# Patient Record
Sex: Male | Born: 1995 | Race: Black or African American | Hispanic: No | Marital: Single | Smoking: Never smoker
Health system: Southern US, Community
[De-identification: ages and names within clinical notes are randomized; demographics above are authoritative.]

## PROBLEM LIST (undated history)

## (undated) ENCOUNTER — Ambulatory Visit (HOSPITAL_COMMUNITY): Admission: EM | Payer: Medicaid Other

## (undated) ENCOUNTER — Emergency Department (HOSPITAL_COMMUNITY): Discharge status: Absent without leave | Payer: Medicaid Other

## (undated) DIAGNOSIS — R011 Cardiac murmur, unspecified: Secondary | ICD-10-CM

## (undated) HISTORY — PX: HERNIA REPAIR: SHX51

---
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
## 2015-03-03 ENCOUNTER — Other Ambulatory Visit (HOSPITAL_COMMUNITY)
Admission: RE | Admit: 2015-03-03 | Discharge: 2015-03-03 | Disposition: A | Discharge status: Home / Self Care | Payer: Medicaid Other | Source: Ambulatory Visit | Attending: Family Medicine | Admitting: Family Medicine

## 2015-03-03 ENCOUNTER — Emergency Department (INDEPENDENT_AMBULATORY_CARE_PROVIDER_SITE_OTHER)
Admission: EM | Admit: 2015-03-03 | Discharge: 2015-03-03 | Disposition: A | Discharge status: Home / Self Care | Payer: Medicaid Other | Source: Home / Self Care | Attending: Family Medicine | Admitting: Family Medicine

## 2015-03-03 ENCOUNTER — Encounter (HOSPITAL_COMMUNITY): Payer: Self-pay | Admitting: Emergency Medicine

## 2015-03-03 DIAGNOSIS — Q549 Hypospadias, unspecified: Secondary | ICD-10-CM

## 2015-03-03 DIAGNOSIS — N342 Other urethritis: Secondary | ICD-10-CM | POA: Diagnosis not present

## 2015-03-03 DIAGNOSIS — Z113 Encounter for screening for infections with a predominantly sexual mode of transmission: Secondary | ICD-10-CM | POA: Insufficient documentation

## 2015-03-03 MED ORDER — CEFTRIAXONE SODIUM 250 MG IJ SOLR
250.0000 mg | Freq: Once | INTRAMUSCULAR | Status: AC
  Administered 2015-03-03: 250 mg via INTRAMUSCULAR

## 2015-03-03 MED ORDER — AZITHROMYCIN 250 MG PO TABS
1000.0000 mg | ORAL_TABLET | Freq: Once | ORAL | Status: AC
  Administered 2015-03-03: 1000 mg via ORAL

## 2015-03-03 MED ORDER — CEFTRIAXONE SODIUM 250 MG IJ SOLR
INTRAMUSCULAR | Status: AC
  Filled 2015-03-03: qty 250

## 2015-03-03 MED ORDER — LIDOCAINE HCL (PF) 1 % IJ SOLN
INTRAMUSCULAR | Status: AC
  Filled 2015-03-03: qty 5

## 2015-03-03 MED ORDER — AZITHROMYCIN 250 MG PO TABS
ORAL_TABLET | ORAL | Status: AC
  Filled 2015-03-03: qty 4

## 2015-03-03 NOTE — Discharge Instructions (Signed)
Thank you for coming in today.   Urethritis Urethritis is an inflammation of the tube through which urine exits your bladder (urethra).  CAUSES Urethritis is often caused by an infection in your urethra. The infection can be viral, like herpes. The infection can also be bacterial, like gonorrhea. RISK FACTORS Risk factors of urethritis include:  Having sex without using a condom.  Having multiple sexual partners.  Having poor hygiene. SIGNS AND SYMPTOMS Symptoms of urethritis are less noticeable in women than in men. These symptoms include:  Burning feeling when you urinate (dysuria).  Discharge from your urethra.  Blood in your urine (hematuria).  Urinating more than usual. DIAGNOSIS  To confirm a diagnosis of urethritis, your health care provider will do the following:  Ask about your sexual history.  Perform a physical exam.  Have you provide a sample of your urine for lab testing.  Use a cotton swab to gently collect a sample from your urethra for lab testing. TREATMENT  It is important to treat urethritis. Depending on the cause, untreated urethritis may lead to serious genital infections and possibly infertility. Urethritis caused by a bacterial infection is treated with antibiotic medicine. All sexual partners must be treated.  HOME CARE INSTRUCTIONS  Do not have sex until the test results are known and treatment is completed, even if your symptoms go away before you finish treatment.  If you were prescribed an antibiotic, finish it all even if you start to feel better. SEEK MEDICAL CARE IF:   Your symptoms are not improved in 3 days.  Your symptoms are getting worse.  You develop abdominal pain or pelvic pain (in women).  You develop joint pain.  You have a fever. SEEK IMMEDIATE MEDICAL CARE IF:   You have severe pain in the belly, back, or side.  You have repeated vomiting. MAKE SURE YOU:  Understand these instructions.  Will watch your  condition.  Will get help right away if you are not doing well or get worse. Document Released: 02/27/2001 Document Revised: 01/18/2014 Document Reviewed: 05/04/2013 [facility] Patient Information 2015 [place], [place]. This information is not intended to replace advice given to you by your health care provider. Make sure you discuss any questions you have with your health care provider.    Hypospadias Hypospadias is a birth defect. The opening of the urethra is not in its usual place. The urethra is the tube that empties urine from the bladder to the outside of the body. In mild cases, the urethra opens close to tip of the penis. In other cases, the urethra opens just below the ridge of the penis. It can also occur in the middle of the shaft or base of the penis. Less often, it opens on or behind the scrotum. Children with hypospadias can also have:  Part of the foreskin absent, giving a hooded or incomplete appearance.  A downward curve to the erect penis (chordee). CAUSES  Hypospadias is a common problem. The cause is not known. It may be passed down from parent to child (hereditary).  SYMPTOMS  The symptoms of hypospadias in boys depend on the location of the defect. In mild cases, there may be no symptoms. In other cases, common symptoms are:   Passing urine in abnormal directions or spraying.  Curved penis with erections (erections are common and normal in infancy).  Larger than normal urethral opening. Untreated hypospadias can have the following symptoms:  Needing to sit down to pass urine because of spraying or abnormal  stream direction.  Difficulty with toilet training.  Embarrassment about being different in his appearance.  Problems with sexual intercourse and fertility.  Painful erections. DIAGNOSIS  The diagnosis is usually made during a physical exam. However, hypospadias may be discovered during a routine circumcision. If the hypospadias is severe, the caregiver may  recommend further testing. These tests include an ultrasound, X-rays, or blood tests to rule out problems like kidney and other birth defects. TREATMENT  Surgery is the only treatment to correct the problem. It is best done before 18 months of age. The goal of surgery is to create a normal urethra and urethral opening. Curvature of the penis is corrected to allow for normal sexual intercourse and fertility. The penis is made to look normal. More than 1 surgery is sometimes needed. Circumcision should not be done at birth or before hypospadias surgery. The foreskin is usually needed during the corrective surgery. HOME CARE INSTRUCTIONS  An older boy may be embarrassed or upset when he becomes aware of his untreated hypospadias. He will need support and understanding to cope. After surgery, home care instructions specific to the type of surgery will be provided. SEEK MEDICAL CARE IF:   Your child has an oral temperature above 102 F (38.9 C).  Your baby is older than 3 months with a rectal temperature of 100.5 F (38.1 C) or higher for more than 1 day.  Your older child has signs of urinary tract infection.  Painful or frequent urination develops.  Urinary accidents happen. MAKE SURE YOU:   Understand these instructions.  Will watch your condition.  Will get help right away if you are not doing well or get worse. Document Released: 09/23/2007 Document Revised: 11/26/2011 Document Reviewed: 10/02/2007 [facility] Patient Information 2015 [place], [place]. This information is not intended to replace advice given to you by your health care provider. Make sure you discuss any questions you have with your health care provider.

## 2015-03-03 NOTE — ED Provider Notes (Signed)
Daniel [name] is a 19 y.o. male who presents to Urgent Care today for penile discharge. Symptoms present for 2 days. No fevers or chills nausea vomiting or diarrhea. No burning or testicle pain. No treatment tried yet.   Past Medical History  Diagnosis Date  . Heart murmur    Past Surgical History  Procedure Laterality Date  . Hernia repair     History  Substance Use Topics  . Smoking status: Current Every Day Smoker  . Smokeless tobacco: Not on file  . Alcohol Use: No   ROS as above Medications: No current facility-administered medications for this encounter.   No current outpatient prescriptions on file.   No Known Allergies   Exam:  BP 135/80 mmHg  Pulse 56  Temp(Src) 98.2 F (36.8 C) (Oral)  Resp 16  SpO2 99% Gen: Well NAD HEENT: EOMI,  MMM Lungs: Normal work of breathing. CTABL Heart: RRR no MRG Abd: NABS, Soft. Nondistended, Nontender Exts: Brisk capillary refill, warm and well perfused.  Genitals: Normal inguinal lymphadenopathy. Testicles are descended bilaterally and nontender with no masses Penis is circumcised with hypospadias distally to just proximal to the glans.  Whitish discharge from the urethra is present. Nontender.  Patient was given 1 g oral azithromycin and 250 mg of intramuscular ceftriaxone prior to discharge.  No results found for this or any previous visit (from the past 24 hour(s)). No results found.  Assessment and Plan: 19 y.o. male with urethritis. Treat with ceftriaxone and azithromycin. Urine cytology for gonorrhea Chlamydia Trichomonas pending. HIV and syphilis test pending.  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 Informed him and provided a handout.  Discussed warning signs or symptoms. Please see discharge instructions. Patient expresses understanding.     Rodolph Bong, MD 03/03/15 [phone]

## 2015-03-03 NOTE — ED Notes (Signed)
Here for std check States no sx States he does not know if he was exposed

## 2015-03-04 LAB — HIV ANTIBODY (ROUTINE TESTING W REFLEX): HIV SCREEN 4TH GENERATION: NONREACTIVE

## 2015-03-04 LAB — RPR: RPR Ser Ql: NONREACTIVE

## 2015-03-04 LAB — URINE CYTOLOGY ANCILLARY ONLY
CHLAMYDIA, DNA PROBE: POSITIVE — AB
Neisseria Gonorrhea: POSITIVE — AB
Trichomonas: NEGATIVE

## 2015-03-05 NOTE — ED Notes (Signed)
Attempted to call patient to inform patient of his positive GC and chlamydia . Was treated adequately while in clinic w rocephin and azithromycin. Message on # provided at check in states this is not a working number (rechecked and dialed again ) letter sent to last known address provided . Form 2124 [employer] completed for #300 and #200 completed and faxed to [facility] for their records

## 2015-03-10 NOTE — ED Notes (Signed)
Patient returned call as result of letter. After verifying ID, discussed positive findings. Advised to practice safer sex

## 2015-05-22 ENCOUNTER — Emergency Department (HOSPITAL_COMMUNITY): Payer: Medicaid Other

## 2015-05-22 ENCOUNTER — Encounter (HOSPITAL_COMMUNITY): Payer: Self-pay

## 2015-05-22 ENCOUNTER — Emergency Department (HOSPITAL_COMMUNITY)
Admission: EM | Admit: 2015-05-22 | Discharge: 2015-05-22 | Disposition: A | Discharge status: Home / Self Care | Payer: Medicaid Other | Attending: Emergency Medicine | Admitting: Emergency Medicine

## 2015-05-22 DIAGNOSIS — Y9367 Activity, basketball: Secondary | ICD-10-CM | POA: Diagnosis not present

## 2015-05-22 DIAGNOSIS — Y92310 Basketball court as the place of occurrence of the external cause: Secondary | ICD-10-CM | POA: Insufficient documentation

## 2015-05-22 DIAGNOSIS — X58XXXA Exposure to other specified factors, initial encounter: Secondary | ICD-10-CM | POA: Insufficient documentation

## 2015-05-22 DIAGNOSIS — S43015A Anterior dislocation of left humerus, initial encounter: Secondary | ICD-10-CM

## 2015-05-22 DIAGNOSIS — Y998 Other external cause status: Secondary | ICD-10-CM | POA: Insufficient documentation

## 2015-05-22 DIAGNOSIS — R011 Cardiac murmur, unspecified: Secondary | ICD-10-CM | POA: Insufficient documentation

## 2015-05-22 DIAGNOSIS — Z720 Tobacco use: Secondary | ICD-10-CM | POA: Insufficient documentation

## 2015-05-22 DIAGNOSIS — S4992XA Unspecified injury of left shoulder and upper arm, initial encounter: Secondary | ICD-10-CM | POA: Diagnosis present

## 2015-05-22 MED ORDER — IBUPROFEN 800 MG PO TABS
800.0000 mg | ORAL_TABLET | Freq: Three times a day (TID) | ORAL | Status: DC
Start: 2015-05-22 — End: 2016-01-10

## 2015-05-22 MED ORDER — PROPOFOL 10 MG/ML IV BOLUS
INTRAVENOUS | Status: AC | PRN
  Administered 2015-05-22: 30 mg via INTRAVENOUS
  Administered 2015-05-22: 68 mg via INTRAVENOUS

## 2015-05-22 MED ORDER — HYDROMORPHONE HCL 1 MG/ML IJ SOLN
1.0000 mg | Freq: Once | INTRAMUSCULAR | Status: DC
  Filled 2015-05-22: qty 1

## 2015-05-22 MED ORDER — PROPOFOL 10 MG/ML IV BOLUS
1.0000 mg/kg | Freq: Once | INTRAVENOUS | Status: AC
  Administered 2015-05-22: 68 mg via INTRAVENOUS
  Filled 2015-05-22: qty 1

## 2015-05-22 MED ORDER — HYDROMORPHONE HCL 1 MG/ML IJ SOLN
1.0000 mg | Freq: Once | INTRAMUSCULAR | Status: AC
  Administered 2015-05-22: 1 mg via INTRAVENOUS

## 2015-05-22 NOTE — ED Notes (Signed)
Pt has periods of bradycardia but is asymptomatic. A&Ox4. Advised to ambulate in hall per MD Knott. Ok for patient to return home as long as he is asymptomatic with steady gait while ambulating. Pt only reports he feels "weak." No other c/c.

## 2015-05-22 NOTE — ED Notes (Signed)
Pt have periods of bradycardia - will continue to monitor before being dc'ed.

## 2015-05-22 NOTE — ED Notes (Signed)
Pt transported by [facility] for right shoulder injury/dislocation.  Pt was playing basketball approx 30 min ago when injury occurred. Pt has hx of left shoulder dislocation x 8.

## 2015-05-22 NOTE — ED Notes (Signed)
Pt ambulatory while maintaining HR 70-90 bpm. Saturations 95-100%. No dizziness noted. No other adverse effects.

## 2015-05-22 NOTE — ED Notes (Signed)
Bed: WA02 Expected date:  Expected time:  Means of arrival:  Comments: EMS- Shoulder dislocation

## 2015-05-22 NOTE — Discharge Instructions (Signed)
Shoulder Dislocation  Your shoulder is made up of three bones: the collar bone (clavicle); the shoulder blade (scapula), which includes the socket (glenoid cavity); and the upper arm bone (humerus). Your shoulder joint is the place where these bones meet. Strong, fibrous tissues hold these bones together (ligaments). Muscles and strong, fibrous tissues that connect the muscles to these bones (tendons) allow your arm to move through this joint. The range of motion of your shoulder joint is more extensive than most of your other joints, and the glenoid cavity is very shallow. That is the reason that your shoulder joint is one of the most unstable joints in your body. It is far more prone to dislocation than your other joints. Shoulder dislocation is when your humerus is forced out of your shoulder joint.  CAUSES  Shoulder dislocation is caused by a forceful impact on your shoulder. This impact usually is from an injury, such as a sports injury or a fall.  SYMPTOMS  Symptoms of shoulder dislocation include:  · Deformity of your shoulder.  · Intense pain.  · Inability to move your shoulder joint.  · Numbness, weakness, or tingling around your shoulder joint (your neck or down your arm).  · Bruising or swelling around your shoulder.  DIAGNOSIS  In order to diagnose a dislocated shoulder, your caregiver will perform a physical exam. Your caregiver also may have an X-ray exam done to see if you have any broken bones. Magnetic resonance imaging (MRI) is a procedure that sometimes is done to help your caregiver see any damage to the soft tissues around your shoulder, particularly your rotator cuff tendons. Additionally, your caregiver also may have electromyography done to measure the electrical discharges produced in your muscles if you have signs or symptoms of nerve damage.  TREATMENT  A shoulder dislocation is treated by placing the humerus back in the joint (reduction). Your caregiver does this either manually (closed  reduction), by moving your humerus back into the joint through manipulation, or through surgery (open reduction). When your humerus is back in place, severe pain should improve almost immediately.  You also may need to have surgery if you have a weak shoulder joint or ligaments, and you have recurring shoulder dislocations, despite rehabilitation. In rare cases, surgery is necessary if your nerves or blood vessels are damaged during the dislocation.  After your reduction, your arm will be placed in a shoulder immobilizer or sling to keep it from moving. Your caregiver will have you wear your shoulder immobilizer or sling for 3 days to 3 weeks, depending on how serious your dislocation is. When your shoulder immobilizer or sling is removed, your caregiver may prescribe physical therapy to help improve the range of motion in your shoulder joint.  HOME CARE INSTRUCTIONS   The following measures can help to reduce pain and speed up the healing process:  · Rest your injured joint. Do not move it. Avoid activities similar to the one that caused your injury.  · Apply ice to your injured joint for the first day or two after your reduction or as directed by your caregiver. Applying ice helps to reduce inflammation and pain.  ¨ Put ice in a plastic bag.  ¨ Place a towel between your skin and the bag.  ¨ Leave the ice on for 15-20 minutes at a time, every 2 hours while you are awake.  · Exercise your hand by squeezing a soft ball. This helps to eliminate stiffness and swelling in your hand and   wrist.  · Take over-the-counter or prescription medicine for pain or discomfort as told by your caregiver.  SEEK IMMEDIATE MEDICAL CARE IF:   · Your shoulder immobilizer or sling becomes damaged.  · Your pain becomes worse rather than better.  · You lose feeling in your arm or hand, or they become white and cold.  MAKE SURE YOU:   · Understand these instructions.  · Will watch your condition.  · Will get help right away if you are not  doing well or get worse.  Document Released: 05/29/2001 Document Revised: 01/18/2014 Document Reviewed: 06/24/2011  Exit[employer]® Patient Information ©2015 [employer]. This information is not intended to replace advice given to you by your health care provider. Make sure you discuss any questions you have with your health care provider.

## 2015-05-22 NOTE — ED Provider Notes (Signed)
CSN: [id number]     Arrival date & time 05/22/15  1340 History   First MD Initiated Contact with Patient 05/22/15 1356     Chief Complaint  Patient presents with  . Shoulder Injury    Left shoulder dislocation approx ago.       (Consider location/radiation/quality/duration/timing/severity/associated sxs/prior Treatment) Patient is a 19 y.o. male presenting with shoulder injury.  Shoulder Injury This is a recurrent problem. The current episode started less than 1 hour ago. The problem occurs constantly. The problem has not changed since onset.Pertinent negatives include no chest pain, no abdominal pain and no shortness of breath. Nothing aggravates the symptoms. Nothing relieves the symptoms. He has tried nothing for the symptoms.    Past Medical History  Diagnosis Date  . Heart murmur    Past Surgical History  Procedure Laterality Date  . Hernia repair     No family history on file. Social History  Substance Use Topics  . Smoking status: Current Every Day Smoker  . Smokeless tobacco: None  . Alcohol Use: No    Review of Systems  Respiratory: Negative for shortness of breath.   Cardiovascular: Negative for chest pain.  Gastrointestinal: Negative for abdominal pain.  All other systems reviewed and are negative.     Allergies  Review of patient's allergies indicates no known allergies.  Home Medications   Prior to Admission medications   Medication Sig Start Date End Date Taking? Authorizing Provider  ibuprofen (ADVIL,MOTRIN) 800 MG tablet Take 1 tablet (800 mg total) by mouth 3 (three) times daily. 05/22/15   Mirian Mo, MD   BP 162/93 mmHg  Pulse 66  Temp(Src) 98.6 F (37 C) (Oral)  Resp 16  SpO2 100% Physical Exam  Constitutional: He is oriented to person, place, and time. He appears well-developed and well-nourished.  HENT:  Head: Normocephalic and atraumatic.  Eyes: Conjunctivae and EOM are normal.  Neck: Normal range of motion. Neck supple.   Cardiovascular: Normal rate, regular rhythm and normal heart sounds.   Pulmonary/Chest: Effort normal and breath sounds normal. No respiratory distress.  Abdominal: He exhibits no distension. There is no tenderness. There is no rebound and no guarding.  Musculoskeletal:       Left shoulder: He exhibits decreased range of motion (dislocated anteriorly by exam, NV intact distally), tenderness, bony tenderness and deformity.  Neurological: He is alert and oriented to person, place, and time.  Skin: Skin is warm and dry.  Vitals reviewed.   ED Course  Reduction of dislocation Date/Time: 05/22/2015 2:42 PM Performed by: Mirian Mo Authorized by: Mirian Mo Consent: Verbal consent obtained. Local anesthesia used: no Patient sedated: yes Sedation type: moderate (conscious) sedation Sedatives: propofol Sedation start date/time: 05/22/2015 2:15 PM Sedation end date/time: 05/22/2015 2:35 PM Vitals: Vital signs were monitored during sedation. Patient tolerance: Patient tolerated the procedure well with no immediate complications   (including critical care time) Labs Review Labs Reviewed - No data to display  Imaging Review Dg Shoulder Left Port  05/22/2015   CLINICAL DATA:  Left shoulder pain after running into another player while playing basketball.  EXAM: LEFT SHOULDER - 1 VIEW  COMPARISON:  Previous examinations, the most recent dated 05/06/2013.  FINDINGS: A single frontal view of the left shoulder was requested and obtained. This demonstrates anterior, inferior dislocation of the humeral head relative to the acromion. This has an appearance similar to that seen on 03/24/2013. No fracture is visible on these views.  IMPRESSION: Anterior left shoulder dislocation.  Electronically Signed   By: Beckie Salts M.D.   On: 05/22/2015 14:29   I have personally reviewed and evaluated these images and lab results as part of my medical decision-making.   EKG Interpretation None      MDM    Final diagnoses:  Anterior shoulder dislocation, left, initial encounter    19 y.o. 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  Reduced uneventfully as above.  Placed in sling.  To fu with orthopedics.  DC home in stable condition  I have reviewed all laboratory and imaging studies if ordered as above  1. Anterior shoulder dislocation, left, initial encounter         Mirian Mo, MD 05/22/15 1444

## 2015-07-06 ENCOUNTER — Encounter (HOSPITAL_COMMUNITY): Payer: Self-pay | Admitting: Emergency Medicine

## 2015-07-06 ENCOUNTER — Emergency Department (HOSPITAL_COMMUNITY)
Admission: EM | Admit: 2015-07-06 | Discharge: 2015-07-06 | Disposition: A | Discharge status: Home / Self Care | Payer: Medicaid Other | Attending: Emergency Medicine | Admitting: Emergency Medicine

## 2015-07-06 DIAGNOSIS — R011 Cardiac murmur, unspecified: Secondary | ICD-10-CM | POA: Insufficient documentation

## 2015-07-06 DIAGNOSIS — Z202 Contact with and (suspected) exposure to infections with a predominantly sexual mode of transmission: Secondary | ICD-10-CM | POA: Diagnosis present

## 2015-07-06 DIAGNOSIS — Z720 Tobacco use: Secondary | ICD-10-CM | POA: Diagnosis not present

## 2015-07-06 DIAGNOSIS — A64 Unspecified sexually transmitted disease: Secondary | ICD-10-CM

## 2015-07-06 DIAGNOSIS — Z791 Long term (current) use of non-steroidal anti-inflammatories (NSAID): Secondary | ICD-10-CM | POA: Diagnosis not present

## 2015-07-06 MED ORDER — AZITHROMYCIN 250 MG PO TABS
1000.0000 mg | ORAL_TABLET | Freq: Once | ORAL | Status: AC
  Administered 2015-07-06: 1000 mg via ORAL
  Filled 2015-07-06: qty 4

## 2015-07-06 MED ORDER — CEFTRIAXONE SODIUM 250 MG IJ SOLR
250.0000 mg | Freq: Once | INTRAMUSCULAR | Status: AC
  Administered 2015-07-06: 250 mg via INTRAMUSCULAR
  Filled 2015-07-06: qty 250

## 2015-07-06 MED ORDER — METRONIDAZOLE 500 MG PO TABS
2000.0000 mg | ORAL_TABLET | Freq: Once | ORAL | Status: AC
  Administered 2015-07-06: 2000 mg via ORAL
  Filled 2015-07-06: qty 4

## 2015-07-06 NOTE — ED Notes (Signed)
Patient states was told that he needs to be checked for chlamydia, after being exposed to a girl that tested positive last week.

## 2015-07-06 NOTE — ED Notes (Signed)
No symptoms 

## 2015-07-06 NOTE — ED Provider Notes (Signed)
CSN: [id number]     Arrival date & time 07/06/15  1101 History  By signing my name below, I, Essence Howell, attest that this documentation has been prepared under the direction and in the presence of Harolyn RutherfordShawn Joy, PA-C Electronically Signed: Charline BillsEssence Howell, ED Scribe 07/06/2015 at 12:15 PM.   Chief Complaint  Patient presents with  . SEXUALLY TRANSMITTED DISEASE   The history is provided by the patient. No language interpreter was used.   HPI Comments: Michele McalpineShyquan L Huertas is a 19 y.o. male who presents to the Emergency Department for STD exposure. Pt reports unprotected sexual intercourse last week with a male partner who tested positive for Chlamydia. He denies fever, chills, nausea, vomiting, abdominal pain, dysuria, penile discharge, penile pain, genital lesions. Denies any previous STD history.   Past Medical History  Diagnosis Date  . Heart murmur    Past Surgical History  Procedure Laterality Date  . Hernia repair     No family history on file. Social History  Substance Use Topics  . Smoking status: Current Every Day Smoker  . Smokeless tobacco: None  . Alcohol Use: No    Review of Systems  Constitutional: Negative for fever and chills.  Gastrointestinal: Negative for nausea, vomiting and abdominal pain.  Genitourinary: Negative for dysuria, discharge, genital sores and penile pain.   Allergies  Review of patient's allergies indicates no known allergies.  Home Medications   Prior to Admission medications   Medication Sig Start Date End Date Taking? Authorizing Provider  ibuprofen (ADVIL,MOTRIN) 800 MG tablet Take 1 tablet (800 mg total) by mouth 3 (three) times daily. 05/22/15   Mirian MoMatthew Gentry, MD   BP 135/72 mmHg  Pulse 55  Temp(Src) 98.2 F (36.8 C) (Oral)  Resp 16  SpO2 100% Physical Exam  Constitutional: He is oriented to person, place, and time. He appears well-developed and well-nourished. No distress.  HENT:  Head: Normocephalic and atraumatic.  Eyes:  Conjunctivae and EOM are normal.  Neck: Neck supple.  Cardiovascular: Normal rate and regular rhythm.   Pulmonary/Chest: Effort normal. No respiratory distress.  Abdominal: Soft.  Neurological: He is alert and oriented to person, place, and time.  Skin: Skin is warm and dry.  Psychiatric: He has a normal mood and affect. His behavior is normal.  Nursing note and vitals reviewed.  ED Course  Procedures (including critical care time) DIAGNOSTIC STUDIES: Oxygen Saturation is 100% on RA, normal by my interpretation.    COORDINATION OF CARE: 11:36 AM-Discussed treatment plan which includes STD screening with pt at bedside and pt agreed to plan.   Labs Review Labs Reviewed  RPR  HIV ANTIBODY (ROUTINE TESTING)  GC/CHLAMYDIA PROBE AMP [facility]   Imaging Review No results found. I have personally reviewed and evaluated these images and lab results as part of my medical decision-making.   EKG Interpretation None      MDM   Final diagnoses:  STD (male)    Jamse BelfastShyquan L Welsch presents for testing and treatment due to exposure to Chlamydia.  Pt counseled on safe sex practices. STD panel ordered. Pt told he would receive results by phone in the next few days. ABX administered empirically for GC/Chlamydia and Trichomonas.   I personally performed the services described in this documentation, which was scribed in my presence. The recorded information has been reviewed and is accurate.    Anselm PancoastShawn C Joy, PA-C 07/06/15 1227  Gilda Creasehristopher J Pollina, MD 07/06/15 1230

## 2015-07-06 NOTE — Discharge Instructions (Signed)
You have been seen today for suspicion of a STD. Your test results may take a few days to be returned. You will be notified by telephone.  You have also been treated with antibiotics today. Follow up with PCP as needed. Return to ED should symptoms worsen.   Sexually Transmitted Disease A sexually transmitted disease (STD) is a disease or infection that may be passed (transmitted) from person to person, usually during sexual activity. This may happen by way of saliva, semen, blood, vaginal mucus, or urine. Common STDs include:  Gonorrhea.  Chlamydia.  Syphilis.  HIV and AIDS.  Genital herpes.  Hepatitis B and C.  Trichomonas.  Human papillomavirus (HPV).  Pubic lice.  Scabies.  Mites.  Bacterial vaginosis. WHAT ARE CAUSES OF STDs? An STD may be caused by bacteria, a virus, or parasites. STDs are often transmitted during sexual activity if one person is infected. However, they may also be transmitted through nonsexual means. STDs may be transmitted after:   Sexual intercourse with an infected person.  Sharing sex toys with an infected person.  Sharing needles with an infected person or using unclean piercing or tattoo needles.  Having intimate contact with the genitals, mouth, or rectal areas of an infected person.  Exposure to infected fluids during birth. WHAT ARE THE SIGNS AND SYMPTOMS OF STDs? Different STDs have different symptoms. Some people may not have any symptoms. If symptoms are present, they may include:  Painful or bloody urination.  Pain in the pelvis, abdomen, vagina, anus, throat, or eyes.  A skin rash, itching, or irritation.  Growths, ulcerations, blisters, or sores in the genital and anal areas.  Abnormal vaginal discharge with or without bad odor.  Penile discharge in men.  Fever.  Pain or bleeding during sexual intercourse.  Swollen glands in the groin area.  Yellow skin and eyes (jaundice). This is seen with hepatitis.  Swollen  testicles.  Infertility.  Sores and blisters in the mouth. HOW ARE STDs DIAGNOSED? To make a diagnosis, your health care provider may:  Take a medical history.  Perform a physical exam.  Take a sample of any discharge to examine.  Swab the throat, cervix, opening to the penis, rectum, or vagina for testing.  Test a sample of your first morning urine.  Perform blood tests.  Perform a Pap test, if this applies.  Perform a colposcopy.  Perform a laparoscopy. HOW ARE STDs TREATED? Treatment depends on the STD. Some STDs may be treated but not cured.  Chlamydia, gonorrhea, trichomonas, and syphilis can be cured with antibiotic medicine.  Genital herpes, hepatitis, and HIV can be treated, but not cured, with prescribed medicines. The medicines lessen symptoms.  Genital warts from HPV can be treated with medicine or by freezing, burning (electrocautery), or surgery. Warts may come back.  HPV cannot be cured with medicine or surgery. However, abnormal areas may be removed from the cervix, vagina, or vulva.  If your diagnosis is confirmed, your recent sexual partners need treatment. This is true even if they are symptom-free or have a negative culture or evaluation. They should not have sex until their health care providers say it is okay.  Your health care provider may test you for infection again 3 months after treatment. HOW CAN I REDUCE MY RISK OF GETTING AN STD? Take these steps to reduce your risk of getting an STD:  Use latex condoms, dental dams, and water-soluble lubricants during sexual activity. Do not use petroleum jelly or oils.  Avoid having  multiple sex partners.  Do not have sex with someone who has other sex partners  Do not have sex with anyone you do not know or who is at high risk for an STD.  Avoid risky sex practices that can break your skin.  Do not have sex if you have open sores on your mouth or skin.  Avoid drinking too much alcohol or taking  illegal drugs. Alcohol and drugs can affect your judgment and put you in a vulnerable position.  Avoid engaging in oral and anal sex acts.  Get vaccinated for HPV and hepatitis. If you have not received these vaccines in the past, talk to your health care provider about whether one or both might be right for you.  If you are at risk of being infected with HIV, it is recommended that you take a prescription medicine daily to prevent HIV infection. This is called pre-exposure prophylaxis (PrEP). You are considered at risk if:  You are a man who has sex with other men (MSM).  You are a heterosexual man or woman and are sexually active with more than one partner.  You take drugs by injection.  You are sexually active with a partner who has HIV.  Talk with your health care provider about whether you are at high risk of being infected with HIV. If you choose to begin PrEP, you should first be tested for HIV. You should then be tested every 3 months for as long as you are taking PrEP. WHAT SHOULD I DO IF I THINK I HAVE AN STD?  See your health care provider.  Tell your sexual partner(s). They should be tested and treated for any STDs.  Do not have sex until your health care provider says it is okay. WHEN SHOULD I GET IMMEDIATE MEDICAL CARE? Contact your health care provider right away if:   You have severe abdominal pain.  You are a man and notice swelling or pain in your testicles.  You are a woman and notice swelling or pain in your vagina.   This information is not intended to replace advice given to you by your health care provider. Make sure you discuss any questions you have with your health care provider.   Document Released: 11/24/2002 Document Revised: 09/24/2014 Document Reviewed: 03/24/2013 Elsevier Interactive Patient Education 2016 [employer]   Emergency Department Resource Guide 1) Find a Doctor and Pay Out of Pocket Although you won't have to find out who is  covered by your insurance plan, it is a good idea to ask around and get recommendations. You will then need to call the office and see if the doctor you have chosen will accept you as a new patient and what types of options they offer for patients who are self-pay. Some doctors offer discounts or will set up payment plans for their patients who do not have insurance, but you will need to ask so you aren't surprised when you get to your appointment.  2) Contact Your Local Health Department Not all health departments have doctors that can see patients for sick visits, but many do, so it is worth a call to see if yours does. If you don't know where your local health department is, you can check in your phone book. The CDC also has a tool to help you locate your state's health department, and many state websites also have listings of all of their local health departments.  3) Find a Walk-in Clinic If your illness is not  likely to be very severe or complicated, you may want to try a walk in clinic. These are popping up all over the country in pharmacies, drugstores, and shopping centers. They're usually staffed by nurse practitioners or physician assistants that have been trained to treat common illnesses and complaints. They're usually fairly quick and inexpensive. However, if you have serious medical issues or chronic medical problems, these are probably not your best option.  No Primary Care Doctor: - Call Health Connect at  [phone] - they can help you locate a primary care doctor that  accepts your insurance, provides certain services, etc. - Physician Referral Service- [phone]  Chronic Pain Problems: Organization         Address  Phone   Notes  [facility]  [phone] Patients need to be referred by their primary care doctor.   Medication Assistance: Organization         Address  Phone   Notes  [facility] Medication [facility] [address] [place], [place] [address] [phone] --Must be a resident of [facility] -- Must have NO insurance coverage whatsoever (no Medicaid/ Medicare, etc.) -- The pt. MUST have a primary care doctor that directs their care regularly and follows them in the community   MedAssist  [phone]   [employer]  [phone]    Agencies that provide inexpensive medical care: Organization         Address  Phone   Notes  [place]  [phone]   [facility]    [phone]   [facility] [place], [place] [address] [phone]   [place] [address] [place] [phone]   Planned Parenthood    [phone]   [facility]    [phone]   [place] [address] [place], [place] Phone:  [phone], Fax:  [phone] Hours of Operation:  9 am - 6 pm, M-F.  Also accepts Medicaid/Medicare and self-pay.  [facility] for [place] [address], [address], [place] Phone: [phone], Fax: [phone]. Hours of Operation:  8:30 am - 5:30 pm, M-F.  Also accepts Medicaid and self-pay.  [facility] [place] [address], [place] Phone: [phone]   [place], [place], [place] [phone], Ext. [phone] Mondays & Thursdays: 7-9 AM.  First 15 patients are seen on a first come, first serve basis.    [place] Providers:  Organization         Address  Phone   Notes  [facility] [address],  [phone] Also accepts self-pay patients.  [facility] [address], [place]  [phone]   [place], [address], [place] [phone]   [facility] [address] [place] [phone]   Lucianne Lei [address], [place]   [phone] Only accepts [place] Access [place] patients after they have their name applied to their card.   Self-Pay (no insurance) in [facility]:  Organization         Address  Phone   Notes  Sickle Cell Patients, [facility] [place] [phone]   Zacarias Pontes  [facility] [place] [phone]   [facility] [place]  [place], [place], [place] [phone]   [facility]/Dr. Osei-Bonsu  [address] [place] or [address], [place] [phone] Phone number for both [place] and [place] locations is the same.  [facility] [address] [place] [phone]   [facility] [address], [place] or [address] [phone]   [facility] [address] [place] [phone], phone; [phone], fax Sees patients 1st and 3rd Saturday of every month.  Must not qualify for public or private insurance (i.e. Medicaid, Medicare, [place] Health Choice, Veterans' Benefits)  Household income should be no more than 200% of the poverty level The clinic cannot treat you if you are pregnant or think you are pregnant  Sexually transmitted diseases are not treated at the clinic.    Dental Care: Organization         Address  Phone  Notes  [facility] Department of [facility] [place] [phone] Accepts children up to age 75 who are enrolled in [place] or [place]; pregnant women with a Medicaid card; and children who have applied for Medicaid or [place] Health Choice, but were declined, whose parents can pay a reduced fee at time of service.  [facility]  [address], [place] [phone] Accepts children up to age 53 who are enrolled  in [place] or [place]; pregnant women with a Medicaid card; and children who have applied for Medicaid or [place] Health Choice, but were declined, whose parents can pay a reduced fee at time of service.  [facility] Adult [facility] Access PROGRAM  [place] [phone] Patients are seen by appointment only. Walk-ins are not accepted. [place] will see patients 73 years of age and older. Monday - Tuesday (8am-5pm) Most Wednesdays (8:30-5pm) $30 per visit, cash only  [facility] Adult Dental Access PROGRAM  [address], [facility] [phone] Patients are seen by appointment only. Walk-ins are not accepted. [place] will see patients 43 years of age and older. One Wednesday Evening (Monthly: Volunteer Based).  $30 per visit, cash only  [place]  [phone] for adults; Children under age 13, call Graduate Pediatric Dentistry at [phone]. Children aged 13-14, please call [phone] to request a pediatric application.  Dental services are provided in all areas of dental care including fillings, crowns and bridges, complete and partial dentures, implants, gum treatment, root canals, and extractions. Preventive care is also provided. Treatment is provided to both adults and children. Patients are selected via a lottery and there is often a waiting list.   [facility] [address] [place]  [phone] [email or web address]   [facility] [address] [place], [place] [phone], Ext. [phone] Second and Fourth Thursday of each month, opens at 6:30 AM; Clinic ends at 9 AM.  Patients are seen on a first-come first-served basis, and a limited number are seen during each clinic.   [facility]  [address]ank[address] [place], [place] [phone]   Eligibility Requirements You must have lived in [place], [place], or [place] counties for at least the last three months.  You cannot be  eligible for state or federal sponsored [employer], including [employer], [place], or [employer].   You generally cannot be eligible for healthcare insurance through your employer.    How to apply: Eligibility screenings are held every Tuesday and Wednesday afternoon from 1:00 pm until 4:00 pm. You do not need an appointment for the interview!  [facility] [address], [place], [place]   [place]  [place] Department  [place]  [phone]    Behavioral Health Resources in the Community: Intensive Outpatient Programs Organization         Address  Phone  Notes  [place]. [address] [place], [place] [phone]   [facility] Outpatient [address] [place], [place]   ADS: Alcohol & Drug Svcs [address], [place], [place]   [place] [address],  [place], [place] or [phone]   Substance Abuse Resources Organization         Address  Phone  Notes  Alcohol and Drug Services  [phone]   [place]  [phone]   [facility] [place]   Chinita Pester  [phone]   Residential & Outpatient Substance Abuse Program  [phone]   Psychological Services Organization         Address  Phone  Notes  [facility] Accident  [place]  [phone]   [place] [address], [place] or [phone]    Mobile Crisis Teams Organization         Address  Phone  Notes  [facility], Mobile Crisis Care Unit  [phone]   [facility]ive Psychotherapeutic Services  [address] [place], [place]   [place] Levels [address] [place] [phone]    Self-Help/Support Groups Organization          Address  Phone             Notes  [place]. of [place] - variety of support groups  [place] Call for more information  Narcotics Anonymous (NA), [facility] [address], [employer] [place]  2 meetings at this location   Special educational needs teacher         Address  Phone  Notes  ASAP Residential Treatment [place],    [place]  [phone]   [facility]  [address] [place] [phone], [place], [place]   [place], [place] [phone] Admissions: 8am-3pm M-F  Incentives Substance [place] [address]    [place], [place] [phone]   [facility] [address] [place], [place], [place]   [facility] [address]  [place], [place]   [facility]ight Programs - Intensive Outpatient [address] Kristeen [address], [place], [place]   [facility] ([place] [place].,  [place], [place] [phone] or [phone]   [facility]idential Treatment Services (RTS) [address], [place], [place] Accepts Medicaid  [facility] [place] [address]  [place] [phone] Substance Abuse/Addiction Treatment   [facility] Organization         Address  Phone  Notes  [facility]  [phone]   Domenic Schwab, PhD [phone], [place]   [phone] or [phone]   [facility]   [address] [place], [place] [phone]   [facility] [address], [place], [place] [phone] Insurance/Medicaid/sponsorship through [facility] and Families [address]                                    [place], [place] [phone] Therapy/tele-psych/case  [facility] [address], [place] [phone]    Dr. Lolly Mustache  [phone]   [facility] [place]  [employer]  [facility] Dept. 1) [address], [place] 2) [address], [place] 3)  [address], [place] [phone]  [phone]   [facility] Child Abuse Hotline 435-309-1663 or [phone] (After Hours)

## 2015-07-07 LAB — RPR: RPR Ser Ql: NONREACTIVE

## 2015-07-07 LAB — GC/CHLAMYDIA PROBE AMP (~~LOC~~) NOT AT ARMC
CHLAMYDIA, DNA PROBE: POSITIVE — AB
NEISSERIA GONORRHEA: NEGATIVE

## 2015-07-07 LAB — HIV ANTIBODY (ROUTINE TESTING W REFLEX): HIV Screen 4th Generation wRfx: NONREACTIVE

## 2015-07-08 ENCOUNTER — Telehealth (HOSPITAL_COMMUNITY): Payer: Self-pay

## 2015-07-08 NOTE — Telephone Encounter (Signed)
[facility]  (+) for chlamydia.  Treated with Zithromax and Rocephin.  Pt ID verified x 2 (DOB & address).  Pt informed of dx, tx rcvd appropriate, notify partner & abstain from sex x 2 weeks.  DHHS form completed and faxed.

## 2015-08-06 ENCOUNTER — Encounter (HOSPITAL_COMMUNITY): Payer: Self-pay | Admitting: *Deleted

## 2015-08-06 ENCOUNTER — Emergency Department (HOSPITAL_COMMUNITY)
Admission: EM | Admit: 2015-08-06 | Discharge: 2015-08-06 | Disposition: A | Discharge status: Home / Self Care | Payer: Medicaid Other | Attending: Emergency Medicine | Admitting: Emergency Medicine

## 2015-08-06 DIAGNOSIS — F17200 Nicotine dependence, unspecified, uncomplicated: Secondary | ICD-10-CM | POA: Insufficient documentation

## 2015-08-06 DIAGNOSIS — Z791 Long term (current) use of non-steroidal anti-inflammatories (NSAID): Secondary | ICD-10-CM | POA: Diagnosis not present

## 2015-08-06 DIAGNOSIS — R011 Cardiac murmur, unspecified: Secondary | ICD-10-CM | POA: Diagnosis not present

## 2015-08-06 DIAGNOSIS — Z202 Contact with and (suspected) exposure to infections with a predominantly sexual mode of transmission: Secondary | ICD-10-CM | POA: Diagnosis present

## 2015-08-06 DIAGNOSIS — R369 Urethral discharge, unspecified: Secondary | ICD-10-CM | POA: Insufficient documentation

## 2015-08-06 MED ORDER — AZITHROMYCIN 250 MG PO TABS
1000.0000 mg | ORAL_TABLET | Freq: Once | ORAL | Status: AC
  Administered 2015-08-06: 1000 mg via ORAL
  Filled 2015-08-06: qty 4

## 2015-08-06 MED ORDER — CEFTRIAXONE SODIUM 250 MG IJ SOLR
250.0000 mg | Freq: Once | INTRAMUSCULAR | Status: AC
  Administered 2015-08-06: 250 mg via INTRAMUSCULAR
  Filled 2015-08-06: qty 250

## 2015-08-06 MED ORDER — STERILE WATER FOR INJECTION IJ SOLN
10.0000 mL | Freq: Once | INTRAMUSCULAR | Status: AC
  Administered 2015-08-06: 10 mL via INTRAMUSCULAR
  Filled 2015-08-06: qty 10

## 2015-08-06 NOTE — ED Provider Notes (Signed)
CSN: [id number]     Arrival date & time 08/06/15  1452 History   First MD Initiated Contact with Patient 08/06/15 1455     Chief Complaint  Patient presents with  . Exposure to STD     (Consider location/radiation/quality/duration/timing/severity/associated sxs/prior Treatment) HPI Comments: Patient presents to the emergency department with chief complaint of STD exposure. States that he had unprotected sex with a partner who has an STD. He reports that he has had some discomfort and discharge from his penis. He denies any fevers or chills. He has not taken anything to alleviate his symptoms. There are no aggravating or alleviating factors.  The history is provided by the patient. No language interpreter was used.    Past Medical History  Diagnosis Date  . Heart murmur    Past Surgical History  Procedure Laterality Date  . Hernia repair     History reviewed. No pertinent family history. Social History  Substance Use Topics  . Smoking status: Current Every Day Smoker  . Smokeless tobacco: None  . Alcohol Use: No    Review of Systems  Constitutional: Negative for fever and chills.  Respiratory: Negative for shortness of breath.   Cardiovascular: Negative for chest pain.  Gastrointestinal: Negative for nausea, vomiting, diarrhea and constipation.  Genitourinary: Positive for discharge. Negative for dysuria.      Allergies  Review of patient's allergies indicates no known allergies.  Home Medications   Prior to Admission medications   Medication Sig Start Date End Date Taking? Authorizing Provider  ibuprofen (ADVIL,MOTRIN) 800 MG tablet Take 1 tablet (800 mg total) by mouth 3 (three) times daily. 05/22/15   Mirian MoMatthew Gentry, MD   BP 133/67 mmHg  Pulse 99  Temp(Src) 98.2 F (36.8 C) (Oral)  Resp 18  SpO2 99% Physical Exam  Constitutional: He is oriented to person, place, and time. He appears well-developed and well-nourished.  HENT:  Head: Normocephalic and  atraumatic.  Eyes: Conjunctivae and EOM are normal.  Neck: Normal range of motion.  Cardiovascular: Normal rate.   Pulmonary/Chest: Effort normal.  Abdominal: He exhibits no distension.  Genitourinary:  No obvious penile discharge, no lesions, masses, or other abnormality or deformity about the penis, scrotum, testes  Musculoskeletal: Normal range of motion.  Neurological: He is alert and oriented to person, place, and time.  Skin: Skin is dry.  Psychiatric: He has a normal mood and affect. His behavior is normal. Judgment and thought content normal.  Nursing note and vitals reviewed.   ED Course  Procedures (including critical care time)   MDM   Final diagnoses:  Penile discharge    Will treat patient for STD. GC cultures obtained in the ED. Will treat with Rocephin and azithromycin. Recommend primary care follow-up. No intercourse for 7-10 days.    Roxy Horsemanobert Marquetta Weiskopf, PA-C 08/06/15 [id number]  Nelva Nayobert Beaton, MD 08/06/15 [phone]

## 2015-08-06 NOTE — ED Notes (Signed)
PT reports unprotected sex with a partner who has an STD

## 2015-08-06 NOTE — ED Notes (Signed)
Declined W/C at D/C and was escorted to lobby by RN. 

## 2015-08-06 NOTE — Discharge Instructions (Signed)
Sexually Transmitted Disease °A sexually transmitted disease (STD) is a disease or infection that may be passed (transmitted) from person to person, usually during sexual activity. This may happen by way of saliva, semen, blood, vaginal mucus, or urine. Common STDs include: °· Gonorrhea. °· Chlamydia. °· Syphilis. °· HIV and AIDS. °· Genital herpes. °· Hepatitis B and C. °· Trichomonas. °· Human papillomavirus (HPV). °· Pubic lice. °· Scabies. °· Mites. °· Bacterial vaginosis. °WHAT ARE CAUSES OF STDs? °An STD may be caused by bacteria, a virus, or parasites. STDs are often transmitted during sexual activity if one person is infected. However, they may also be transmitted through nonsexual means. STDs may be transmitted after:  °· Sexual intercourse with an infected person. °· Sharing sex toys with an infected person. °· Sharing needles with an infected person or using unclean piercing or tattoo needles. °· Having intimate contact with the genitals, mouth, or rectal areas of an infected person. °· Exposure to infected fluids during birth. °WHAT ARE THE SIGNS AND SYMPTOMS OF STDs? °Different STDs have different symptoms. Some people may not have any symptoms. If symptoms are present, they may include: °· Painful or bloody urination. °· Pain in the pelvis, abdomen, vagina, anus, throat, or eyes. °· A skin rash, itching, or irritation. °· Growths, ulcerations, blisters, or sores in the genital and anal areas. °· Abnormal vaginal discharge with or without bad odor. °· Penile discharge in men. °· Fever. °· Pain or bleeding during sexual intercourse. °· Swollen glands in the groin area. °· Yellow skin and eyes (jaundice). This is seen with hepatitis. °· Swollen testicles. °· Infertility. °· Sores and blisters in the mouth. °HOW ARE STDs DIAGNOSED? °To make a diagnosis, your health care provider may: °· Take a medical history. °· Perform a physical exam. °· Take a sample of any discharge to examine. °· Swab the throat,  cervix, opening to the penis, rectum, or vagina for testing. °· Test a sample of your first morning urine. °· Perform blood tests. °· Perform a Pap test, if this applies. °· Perform a colposcopy. °· Perform a laparoscopy. °HOW ARE STDs TREATED? °Treatment depends on the STD. Some STDs may be treated but not cured. °· Chlamydia, gonorrhea, trichomonas, and syphilis can be cured with antibiotic medicine. °· Genital herpes, hepatitis, and HIV can be treated, but not cured, with prescribed medicines. The medicines lessen symptoms. °· Genital warts from HPV can be treated with medicine or by freezing, burning (electrocautery), or surgery. Warts may come back. °· HPV cannot be cured with medicine or surgery. However, abnormal areas may be removed from the cervix, vagina, or vulva. °· If your diagnosis is confirmed, your recent sexual partners need treatment. This is true even if they are symptom-free or have a negative culture or evaluation. They should not have sex until their health care providers say it is okay. °· Your health care provider may test you for infection again 3 months after treatment. °HOW CAN I REDUCE MY RISK OF GETTING AN STD? °Take these steps to reduce your risk of getting an STD: °· Use latex condoms, dental dams, and water-soluble lubricants during sexual activity. Do not use petroleum jelly or oils. °· Avoid having multiple sex partners. °· Do not have sex with someone who has other sex partners °· Do not have sex with anyone you do not know or who is at high risk for an STD. °· Avoid risky sex practices that can break your skin. °· Do not have sex   if you have open sores on your mouth or skin. °· Avoid drinking too much alcohol or taking illegal drugs. Alcohol and drugs can affect your judgment and put you in a vulnerable position. °· Avoid engaging in oral and anal sex acts. °· Get vaccinated for HPV and hepatitis. If you have not received these vaccines in the past, talk to your health care  provider about whether one or both might be right for you. °· If you are at risk of being infected with HIV, it is recommended that you take a prescription medicine daily to prevent HIV infection. This is called pre-exposure prophylaxis (PrEP). You are considered at risk if: °¨ You are a man who has sex with other men (MSM). °¨ You are a heterosexual man or woman and are sexually active with more than one partner. °¨ You take drugs by injection. °¨ You are sexually active with a partner who has HIV. °· Talk with your health care provider about whether you are at high risk of being infected with HIV. If you choose to begin PrEP, you should first be tested for HIV. You should then be tested every 3 months for as long as you are taking PrEP. °WHAT SHOULD I DO IF I THINK I HAVE AN STD? °· See your health care provider. °· Tell your sexual partner(s). They should be tested and treated for any STDs. °· Do not have sex until your health care provider says it is okay. °WHEN SHOULD I GET IMMEDIATE MEDICAL CARE? °Contact your health care provider right away if:  °· You have severe abdominal pain. °· You are a man and notice swelling or pain in your testicles. °· You are a woman and notice swelling or pain in your vagina. °  °This information is not intended to replace advice given to you by your health care provider. Make sure you discuss any questions you have with your health care provider. °  °Document Released: 11/24/2002 Document Revised: 09/24/2014 Document Reviewed: 03/24/2013 °Elsevier Interactive Patient Education ©2016 [employer] Inc[employer] ° °

## 2015-08-10 LAB — GC/CHLAMYDIA PROBE AMP (~~LOC~~) NOT AT ARMC
CHLAMYDIA, DNA PROBE: NEGATIVE
NEISSERIA GONORRHEA: NEGATIVE

## 2015-08-23 ENCOUNTER — Encounter (HOSPITAL_COMMUNITY): Payer: Self-pay

## 2015-08-23 ENCOUNTER — Emergency Department (HOSPITAL_COMMUNITY): Payer: Medicaid Other

## 2015-08-23 ENCOUNTER — Emergency Department (HOSPITAL_COMMUNITY)
Admission: EM | Admit: 2015-08-23 | Discharge: 2015-08-23 | Disposition: A | Discharge status: Home / Self Care | Payer: Medicaid Other | Attending: Emergency Medicine | Admitting: Emergency Medicine

## 2015-08-23 DIAGNOSIS — Y9289 Other specified places as the place of occurrence of the external cause: Secondary | ICD-10-CM | POA: Diagnosis not present

## 2015-08-23 DIAGNOSIS — Y9389 Activity, other specified: Secondary | ICD-10-CM | POA: Insufficient documentation

## 2015-08-23 DIAGNOSIS — R011 Cardiac murmur, unspecified: Secondary | ICD-10-CM | POA: Insufficient documentation

## 2015-08-23 DIAGNOSIS — Y998 Other external cause status: Secondary | ICD-10-CM | POA: Insufficient documentation

## 2015-08-23 DIAGNOSIS — S60221A Contusion of right hand, initial encounter: Secondary | ICD-10-CM

## 2015-08-23 DIAGNOSIS — S6991XA Unspecified injury of right wrist, hand and finger(s), initial encounter: Secondary | ICD-10-CM | POA: Diagnosis present

## 2015-08-23 DIAGNOSIS — Z791 Long term (current) use of non-steroidal anti-inflammatories (NSAID): Secondary | ICD-10-CM | POA: Insufficient documentation

## 2015-08-23 DIAGNOSIS — F17200 Nicotine dependence, unspecified, uncomplicated: Secondary | ICD-10-CM | POA: Insufficient documentation

## 2015-08-23 NOTE — ED Notes (Signed)
Patient transported to X-ray 

## 2015-08-23 NOTE — Discharge Instructions (Signed)
Ibuprofen or Tylenol for pain. Ice her hand. Follow-up as needed. X-ray is negative today.   Hand Contusion A hand contusion is a deep bruise on your hand area. Contusions are the result of an injury that caused bleeding under the skin. The contusion may turn blue, purple, or yellow. Minor injuries will give you a painless contusion, but more severe contusions may stay painful and swollen for a few weeks. CAUSES  A contusion is usually caused by a blow, trauma, or direct force to an area of the body. SYMPTOMS   Swelling and redness of the injured area.  Discoloration of the injured area.  Tenderness and soreness of the injured area.  Pain. DIAGNOSIS  The diagnosis can be made by taking a history and performing a physical exam. An X-ray, CT scan, or MRI may be needed to determine if there were any associated injuries, such as broken bones (fractures). TREATMENT  Often, the best treatment for a hand contusion is resting, elevating, icing, and applying cold compresses to the injured area. Over-the-counter medicines may also be recommended for pain control. HOME CARE INSTRUCTIONS   Put ice on the injured area.  Put ice in a plastic bag.  Place a towel between your skin and the bag.  Leave the ice on for 15-20 minutes, 03-04 times a day.  Only take over-the-counter or prescription medicines as directed by your caregiver. Your caregiver may recommend avoiding anti-inflammatory medicines (aspirin, ibuprofen, and naproxen) for 48 hours because these medicines may increase bruising.  If told, use an elastic wrap as directed. This can help reduce swelling. You may remove the wrap for sleeping, showering, and bathing. If your fingers become numb, cold, or blue, take the wrap off and reapply it more loosely.  Elevate your hand with pillows to reduce swelling.  Avoid overusing your hand if it is painful. SEEK IMMEDIATE MEDICAL CARE IF:   You have increased redness, swelling, or pain in your  hand.  Your swelling or pain is not relieved with medicines.  You have loss of feeling in your hand or are unable to move your fingers.  Your hand turns cold or blue.  You have pain when you move your fingers.  Your hand becomes warm to the touch.  Your contusion does not improve in 2 days. MAKE SURE YOU:   Understand these instructions.  Will watch your condition.  Will get help right away if you are not doing well or get worse.   This information is not intended to replace advice given to you by your health care provider. Make sure you discuss any questions you have with your health care provider.   Document Released: 02/23/2002 Document Revised: 05/28/2012 Document Reviewed: 02/25/2012 Elsevier Interactive Patient Education [employer]2016 Else[employer] Inc.
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
## 2015-10-07 ENCOUNTER — Emergency Department (HOSPITAL_COMMUNITY)
Admission: EM | Admit: 2015-10-07 | Discharge: 2015-10-07 | Disposition: A | Discharge status: Home / Self Care | Payer: Medicaid Other | Attending: Emergency Medicine | Admitting: Emergency Medicine

## 2015-10-07 ENCOUNTER — Emergency Department (HOSPITAL_COMMUNITY): Payer: Medicaid Other

## 2015-10-07 ENCOUNTER — Encounter (HOSPITAL_COMMUNITY): Payer: Self-pay

## 2015-10-07 DIAGNOSIS — Z791 Long term (current) use of non-steroidal anti-inflammatories (NSAID): Secondary | ICD-10-CM | POA: Diagnosis not present

## 2015-10-07 DIAGNOSIS — F17200 Nicotine dependence, unspecified, uncomplicated: Secondary | ICD-10-CM | POA: Diagnosis not present

## 2015-10-07 DIAGNOSIS — R011 Cardiac murmur, unspecified: Secondary | ICD-10-CM | POA: Insufficient documentation

## 2015-10-07 DIAGNOSIS — M25512 Pain in left shoulder: Secondary | ICD-10-CM | POA: Insufficient documentation

## 2015-10-07 DIAGNOSIS — Z87828 Personal history of other (healed) physical injury and trauma: Secondary | ICD-10-CM | POA: Diagnosis not present

## 2015-10-07 NOTE — ED Provider Notes (Signed)
CSN: [id number]     Arrival date & time 10/07/15  1725 History  By signing my name below, I, Lyndel Safe, attest that this documentation has been prepared under the direction and in the presence of Glean Hess, [address] Electronically Signed: Lyndel Safe, ED Scribe. 10/07/2015. 5:54 PM.  Chief Complaint  Patient presents with  . Shoulder Pain    The history is provided by the patient. No language interpreter was used.    HPI Comments: Daniel [name] is a 20 y.o. male, with a h/o of recurrent anterior left shoulder dislocation, who presents to the Emergency Department for chief complaint of recurrent left shoulder dislocation. The pt denies his shoulder to be dislocated currently but states he is 'tired of having to lay in certain positions' and notes pain with ROM of left arm. He states his left shoulder dislocates with abduction of left arm and that he has to be 'extra careful' with movement of his left arm. The pt was last seen in the ED 4 months ago for anterior left shoulder dislocation when the joint was reduced without complications and he was given an orthopedic follow up. He denies current dislocation, numbness, tingling or weakness to left arm. There is no joint swelling or overlying skin changes to left shoulder.   Past Medical History  Diagnosis Date  . Heart murmur    Past Surgical History  Procedure Laterality Date  . Hernia repair     No family history on file. Social History  Substance Use Topics  . Smoking status: Current Some Day Smoker  . Smokeless tobacco: None  . Alcohol Use: No      Review of Systems  Musculoskeletal: Positive for arthralgias (with ROM of left shoulder). Negative for joint swelling.  Skin: Negative for color change and wound.  Neurological: Negative for weakness and numbness.    Allergies  Review of patient's allergies indicates no known allergies.  Home Medications   Prior to Admission medications   Medication Sig Start  Date End Date Taking? Authorizing Provider  ibuprofen (ADVIL,MOTRIN) 800 MG tablet Take 1 tablet (800 mg total) by mouth 3 (three) times daily. 05/22/15   Mirian Mo, MD    BP 133/76 mmHg  Pulse 75  Temp(Src) 97.6 F (36.4 C) (Oral)  Resp 18  Wt 144 lb 2 oz (65.375 kg)  SpO2 97% Physical Exam  Constitutional: He is oriented to person, place, and time. He appears well-developed and well-nourished. No distress.  HENT:  Head: Normocephalic and atraumatic.  Right Ear: External ear normal.  Left Ear: External ear normal.  Nose: Nose normal.  Eyes: Conjunctivae and EOM are normal. Right eye exhibits no discharge. Left eye exhibits no discharge. No scleral icterus.  Neck: Normal range of motion. Neck supple.  Cardiovascular: Normal rate, regular rhythm and intact distal pulses.   Pulmonary/Chest: Effort normal and breath sounds normal. No respiratory distress.  Musculoskeletal: He exhibits no edema.       Left shoulder: He exhibits decreased range of motion and tenderness. He exhibits no swelling, no effusion, no crepitus, no deformity, no spasm, normal pulse and normal strength.  Mild TTP of left anterior shoulder with decreased ROM due to pain. No deformity. Compartments soft. Strength and sensation intact. Distal pulses intact.  Neurological: He is alert and oriented to person, place, and time. He has normal strength. No sensory deficit.  Skin: Skin is warm and dry. He is not diaphoretic.  Psychiatric: He has a normal mood and affect. His behavior  is normal.  Nursing note and vitals reviewed.   ED Course  Procedures   DIAGNOSTIC STUDIES: Oxygen Saturation is 97% on RA, normal by my interpretation.    COORDINATION OF CARE: 5:53 PM Discussed treatment plan which includes to order Xray of left shoulder with pt. Pt acknowledges and agrees to plan.   Imaging Review Dg Shoulder Left  10/07/2015  CLINICAL DATA:  Left shoulder pain, history of multiple dislocations EXAM: LEFT SHOULDER  - 2+ VIEW COMPARISON:  05/22/2015 FINDINGS: There are changes consistent with a mild Hill-Sachs deformity. No fracture or dislocation is noted. The underlying bony structures are within normal limits. 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 No acute abnormality noted. Electronically Signed   By: Alcide Clever M.D.   On: 10/07/2015 18:15   I have personally reviewed and evaluated these images results as part of my medical decision-making.   MDM   Final diagnoses:  Left shoulder pain    20 year old male presents for evaluation of recurrent left shoulder dislocation. He currently denies symptoms.  On exam, patient has mild TTP of his left anterior shoulder with decreased ROM due to pain. No deformity. Compartments soft. Distal pulses intact. Strength and sensation intact.  Imaging of left shoulder reveals mild hill-sachs deformity from prior dislocations, negative for acute abnormality. Will give shoulder sling for comfort. Patient to follow-up with orthopedics for further evaluation and management. Return precautions discussed. Patient verbalizes his understanding and is in agreement with plan.  BP 154/85 mmHg  Pulse 80  Temp(Src) 97.6 F (36.4 C) (Oral)  Resp 16  Wt 65.375 kg  SpO2 99%  I personally performed the services described in this documentation, which was scribed in my presence. The recorded information has been reviewed and is accurate.   Mady Gemma, PA-C 10/07/15 1844  Lavera Guise, MD 10/08/15 1315

## 2015-10-07 NOTE — ED Notes (Signed)
Furthermore, pt reports he is in no pain and his left shoulder is not currently dislocated and denies recent injury to his shoulder.

## 2015-10-07 NOTE — Discharge Instructions (Signed)
1. Medications: usual home medications 2. Treatment: rest, drink plenty of fluids 3. Follow Up: please followup with orthopedics for discussion of your diagnoses and further evaluation after today's visit; if you do not have a primary care doctor use the resource guide provided to find one; please return to the ER for severe pain, shoulder dislocation, numbness, weakness, new or worsening symptoms   Shoulder Pain The shoulder is the joint that connects your arm to your body. Muscles and band-like tissues that connect bones to muscles (tendons) hold the joint together. Shoulder pain is felt if an injury or medical problem affects one or more parts of the shoulder. HOME CARE   Put ice on the sore area.  Put ice in a plastic bag.  Place a towel between your skin and the bag.  Leave the ice on for 15-20 minutes, 03-04 times a day for the first 2 days.  Stop using cold packs if they do not help with the pain.  If you were given something to keep your shoulder from moving (sling; shoulder immobilizer), wear it as told. Only take it off to shower or bathe.  Move your arm as little as possible, but keep your hand moving to prevent puffiness (swelling).  Squeeze a soft ball or foam pad as much as possible to help prevent swelling.  Take medicine as told by your doctor. GET HELP IF:  You have progressing new pain in your arm, hand, or fingers.  Your hand or fingers get cold.  Your medicine does not help lessen your pain. GET HELP RIGHT AWAY IF:   Your arm, hand, or fingers are numb or tingling.  Your arm, hand, or fingers are puffy (swollen), painful, or turn white or blue. MAKE SURE YOU:   Understand these instructions.  Will watch your condition.  Will get help right away if you are not doing well or get worse.   This information is not intended to replace advice given to you by your health care provider. Make sure you discuss any questions you have with your health care  provider.   Document Released: 02/20/2008 Document Revised: 09/24/2014 Document Reviewed: 12/27/2014 Elsevier Interactive Patient Education 2016 [employer].   Emergency Department Resource Guide 1) Find a Doctor and Pay Out of Pocket Although you won't have to find out who is covered by your insurance plan, it is a good idea to ask around and get recommendations. You will then need to call the office and see if the doctor you have chosen will accept you as a new patient and what types of options they offer for patients who are self-pay. Some doctors offer discounts or will set up payment plans for their patients who do not have insurance, but you will need to ask so you aren't surprised when you get to your appointment.  2) Contact Your Local Health Department Not all health departments have doctors that can see patients for sick visits, but many do, so it is worth a call to see if yours does. If you don't know where your local health department is, you can check in your phone book. The CDC also has a tool to help you locate your state's health department, and many state websites also have listings of all of their local health departments.  3) Find a Walk-in Clinic If your illness is not likely to be very severe or complicated, you may want to try a walk in clinic. These are popping up all over the country in  pharmacies, drugstores, and shopping centers. They're usually staffed by nurse practitioners or physician assistants that have been trained to treat common illnesses and complaints. They're usually fairly quick and inexpensive. However, if you have serious medical issues or chronic medical problems, these are probably not your best option.  No Primary Care Doctor: - Call Health Connect at  [phone] - they can help you locate a primary care doctor that  accepts your insurance, provides certain services, etc. - Physician Referral Service- [phone]  Chronic Pain  Problems: Organization         Address  Phone   Notes  [facility]  [phone] Patients need to be referred by their primary care doctor.   Medication Assistance: Organization         Address  Phone   Notes  [facility] Medication [facility] [address] [place] [place], [place] [address] [phone] --Must be a resident of [facility] -- Must have NO insurance coverage whatsoever (no Medicaid/ Medicare, etc.) -- The pt. MUST have a primary care doctor that directs their care regularly and follows them in the community   [facility]  [phone]   [employer]  [phone]    Agencies that provide inexpensive medical care: Organization         Address  Phone   Notes  [facility]  [phone]   [facility]    [phone]   [facility] [address] [place], [place] [address] [phone]   [facility] [address], [place] [phone]   Planned Parenthood    [phone]   [facility]    [phone]   [facility]  [address], [place] Phone:  [phone], Fax:  [phone] Hours of Operation:  9 am - 6 pm, M-F.  Also accepts Medicaid/Medicare and self-pay.  [facility]  [address], [place] Phone: [phone], Fax: [phone]. Hours of Operation:  8:30 am - 5:30 pm, M-F.  Also accepts Medicaid and self-pay.  [facility] [place] [address] [place] Phone: [phone]   [facility] [address] Natasha Bence [place], [place] [phone], Ext. [phone] Mondays & Thursdays: 7-9 AM.  First 15 patients are seen on a first come, first serve basis.    Medicaid-accepting [facility] Providers:  Organization         Address  Phone   Notes  [facility] [address], [place] [phone] Also  accepts self-pay patients.  [facility] [address] Laurell Josephs [place], [place]  [phone]   [facility] [address], [place] [phone]   [facility] [address] [place] [phone]   Renaye Rakers [address], [place]   [phone] Only accepts [place] patients after they have their name applied to their card.   Self-Pay (no insurance) in [facility]:  Organization         Address  Phone   Notes  Sickle Cell Patients, [facility] [address] [place], [place] [phone]   [facility] [address] [place], [place] [phone]   [facility] [place]  [address],  [place] [phone]   [facility]/Dr. Osei-Bonsu  [address], [place] or [address], [place] [phone] Phone number for both [place] and [place] locations is the same.  [facility] and [facility] [address], [place] [phone]   [facility] [address], [place] or [address] [phone]   [facility] [address] [place] [phone], phone; [phone], fax Sees patients 1st and 3rd Saturday of every month.  Must not qualify for public or private insurance (i.e. Medicaid, Medicare, [place] Health Choice, Veterans' Benefits)  Household income should be no more than 200% of the poverty level The clinic cannot treat you if you are pregnant or think you are pregnant  Sexually transmitted diseases are not treated at the clinic.    Dental Care: Organization         Address  Phone  Notes  [facility] Department of [facility] [address] [place], [place] [phone] Accepts children up to age 71 who are enrolled in [place] or [place] Health Choice; pregnant  women with a Medicaid card; and children who have applied for Medicaid or [place] Health Choice, but were declined, whose parents can pay a reduced fee at time of service.  [facility]  [address], [place] [phone] Accepts children up to age 32 who are enrolled in [place] or [place] Health Choice; pregnant women with a Medicaid card; and children who have applied for Medicaid or [place] Health Choice, but were declined, whose parents can pay a reduced fee at time of service.  [facility]  [address] [place], [place] [phone] Patients are seen by appointment only. Walk-ins are not accepted. [facility] will see patients 45 years of age and older. Monday - Tuesday (8am-5pm) Most Wednesdays (8:30-5pm) $30 per visit, cash only  [facility] Adult Dental Access PRO[facility]  [address], [facility] [phone] Patients are seen by appointment only. Walk-ins are not accepted. [facility] will see patients 73 years of age and older. One Wednesday Evening (Monthly: Volunteer Based).  $30 per visit, cash only  [employer]  [phone] for adults; Children under age 80, call Graduate Pediatric Dentistry at [phone]. Children aged 40-14, please call [phone] to request a pediatric application.  Dental services are provided in all areas of dental care including fillings, crowns and bridges, complete and partial dentures, implants, gum treatment, root canals, and extractions. Preventive care is also provided. Treatment is provided to both adults and children. Patients are selected via a lottery and there is often a waiting list.   [facility] [address] [place]  [phone] [email or web address]   [facility] [address] [place], [place] [phone], Ext. [phone] Second and Fourth Thursday of each month, opens at 6:30 AM; Clinic ends at 9 AM.  Patients are  seen on a first-come first-served basis, and a limited number are seen during each clinic.   [facility]  [address] Griffins [place], [place] [phone]   Eligibility Requirements You must have lived in [place], [place], or [place] counties for at least the last three months.   You cannot be eligible for state or federal sponsored [place], including [employer], [place], or [employer].   You generally cannot  be eligible for healthcare insurance through your employer.    How to apply: Eligibility screenings are held every Tuesday and Wednesday afternoon from 1:00 pm until 4:00 pm. You do not need an appointment for the interview!  [facility] [address], [place], [place] [phone]   [facility]  [phone]   [facility]  [phone]   [facility]  [phone]    Behavioral Health Resources in the Community: Intensive Outpatient Programs Organization         Address  Phone  Notes  [facility] [address], [place], [place] [phone]   [facility] [address], [place], [place] [phone]   [facility] [address], [place], [place]  [phone]   [facility] [address]  [place], [place] [phone] or [phone]   Substance Abuse Resources Organization         Address  Phone  Notes  [facility]  [phone]   [facility]  [phone]   [facility] [place]  [phone]   Floydene Flock  [phone]   Residential & Outpatient Substance Abuse Program  [phone]   Psychological Services Organization         Address  Phone  Notes  [facility]  [phone]   [facility]  [phone]   [facility] [address] [place] [phone] or [phone]    Mobile Crisis  Teams Organization         Address  Phone  Notes  [facility], Mobile Crisis Care Unit  [phone]   [facility]  [address] [place], [place] [phone]   Doristine Locks [address] [place] [phone]    Self-Help/Support Groups Organization         Address  Phone             Notes  [facility] [place] - variety of support groups  336- I7437963 Call for more information  Narcotics Anonymous (NA), [facility] [address], [employer] [place]  2 meetings at this location   Statistician         Address  Phone  Notes  [facility] [address],    [place]  [phone]   [facility]  [address] [place] [address], [place], [place] [phone]   [facility] [address] [place], [place] [phone] Admissions: 8am-3pm M-F  [facility] [address],    [place], [place] [phone]   [facility] [address] [place], [place], [place] [phone]   [facility] [address]  [place], [place] [phone]   [facility] - Intensive Outpatient [address]ure[address], [place], [place] [phone]   [facility] (Addiction Recovery [facility] [address] [place]  [place], [place] [phone] or [phone]   [facility] ([facility]) [address], [place], [place] [phone] Accepts Medicaid  [facility] [address],  [place] [phone] Substance Abuse/Addiction Treatment   [facility] Organization         Address  Phone  Notes  [facility]  [phone]   Angie Fava, PhD [address] Skeeters [place], [place]   [phone] or [phone]   [facility]   [address]  [place], [place] [phone]   [facility] [address] [place], [place] [phone]  Insurance/Medicaid/sponsorship through [employer] and Families [address]                                    [place], [place] [phone] Therapy/tele-psych/case  [facility] [address]   [place], [place] [phone]    Dr. Lolly Mustache  [phone]   [facility]  [facility] Dept. 1) [address] [place] 2) [address], [place] 3)  [address], [place] [phone]  [phone]   [facility] Child Abuse Hotline (506)539-3170 or [phone] (After Hours)

## 2015-10-07 NOTE — ED Notes (Signed)
Pt reports he has been having problems with his left shoulder "popping out". Hx of dislocations. He states he doesn't feel like its dislocated now but he states it keeps "popping out and I am tired of it." He states it was last dislocated a few months ago and had it popped back in. He is tried of it continuing to pop out.

## 2016-01-10 ENCOUNTER — Emergency Department (HOSPITAL_COMMUNITY)
Admission: EM | Admit: 2016-01-10 | Discharge: 2016-01-10 | Disposition: A | Discharge status: Home / Self Care | Payer: Medicaid Other | Attending: Emergency Medicine | Admitting: Emergency Medicine

## 2016-01-10 ENCOUNTER — Emergency Department (HOSPITAL_COMMUNITY): Payer: Medicaid Other

## 2016-01-10 ENCOUNTER — Encounter (HOSPITAL_COMMUNITY): Payer: Self-pay | Admitting: *Deleted

## 2016-01-10 DIAGNOSIS — Y9389 Activity, other specified: Secondary | ICD-10-CM | POA: Diagnosis not present

## 2016-01-10 DIAGNOSIS — Y9289 Other specified places as the place of occurrence of the external cause: Secondary | ICD-10-CM | POA: Diagnosis not present

## 2016-01-10 DIAGNOSIS — S43035A Inferior dislocation of left humerus, initial encounter: Secondary | ICD-10-CM | POA: Insufficient documentation

## 2016-01-10 DIAGNOSIS — R011 Cardiac murmur, unspecified: Secondary | ICD-10-CM | POA: Insufficient documentation

## 2016-01-10 DIAGNOSIS — F17200 Nicotine dependence, unspecified, uncomplicated: Secondary | ICD-10-CM | POA: Diagnosis not present

## 2016-01-10 DIAGNOSIS — Y998 Other external cause status: Secondary | ICD-10-CM | POA: Insufficient documentation

## 2016-01-10 DIAGNOSIS — S43015A Anterior dislocation of left humerus, initial encounter: Secondary | ICD-10-CM | POA: Insufficient documentation

## 2016-01-10 DIAGNOSIS — S43005A Unspecified dislocation of left shoulder joint, initial encounter: Secondary | ICD-10-CM

## 2016-01-10 DIAGNOSIS — S4992XA Unspecified injury of left shoulder and upper arm, initial encounter: Secondary | ICD-10-CM | POA: Diagnosis present

## 2016-01-10 MED ORDER — DIAZEPAM 5 MG/ML IJ SOLN
INTRAMUSCULAR | Status: DC | PRN
  Administered 2016-01-10: 5 mg via INTRAVENOUS

## 2016-01-10 MED ORDER — HYDROCODONE-ACETAMINOPHEN 5-325 MG PO TABS: 1.0000 | ORAL_TABLET | Freq: Four times a day (QID) | ORAL | Status: DC | PRN

## 2016-01-10 MED ORDER — FENTANYL CITRATE (PF) 100 MCG/2ML IJ SOLN
50.0000 ug | INTRAMUSCULAR | Status: DC | PRN
  Administered 2016-01-10: 50 ug via INTRAVENOUS
  Filled 2016-01-10: qty 2

## 2016-01-10 MED ORDER — LIDOCAINE-EPINEPHRINE 2 %-1:100000 IJ SOLN: 20.0000 mL | Freq: Once | INTRAMUSCULAR | Status: DC

## 2016-01-10 MED ORDER — LIDOCAINE HCL 2 % IJ SOLN
20.0000 mL | Freq: Once | INTRAMUSCULAR | Status: AC
  Administered 2016-01-10: 200 mg
  Filled 2016-01-10: qty 20

## 2016-01-10 MED ORDER — HYDROMORPHONE HCL 1 MG/ML IJ SOLN
1.0000 mg | Freq: Once | INTRAMUSCULAR | Status: DC
  Filled 2016-01-10: qty 1

## 2016-01-10 MED ORDER — HYDROMORPHONE HCL 1 MG/ML IJ SOLN
INTRAMUSCULAR | Status: DC | PRN
  Administered 2016-01-10: 1 mg via INTRAVENOUS

## 2016-01-10 MED ORDER — DIAZEPAM 5 MG/ML IJ SOLN
5.0000 mg | Freq: Once | INTRAMUSCULAR | Status: DC
  Filled 2016-01-10: qty 2
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
## 2016-01-10 NOTE — Discharge Instructions (Signed)
Take motrin for pain.   Take vicodin for severe pain. Do NOT drive with it.   Use sling for 3-4 days.   You need to see orthopedic doctor. You may need definitive surgery in the future.   Return to ER if you have shoulder dislocation, worse shoulder pain, numbness in the fingers.

## 2016-01-10 NOTE — Progress Notes (Signed)
Patient listed as having Medicaid [place] insurance without a pcp.  EDCM spoke to patient's mother at bedside.  Patient's mother confirms patient's pcp is located at [facility] on [address] [phone].  System updated.

## 2016-01-10 NOTE — ED Notes (Signed)
Bed: WA09 Expected date:  Expected time:  Means of arrival:  Comments: EMS-dislocated shoulder-room 9 or 11
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
## 2016-08-14 ENCOUNTER — Ambulatory Visit (HOSPITAL_COMMUNITY)
Admission: EM | Admit: 2016-08-14 | Discharge: 2016-08-14 | Disposition: A | Discharge status: Home / Self Care | Payer: Medicaid Other | Attending: Emergency Medicine | Admitting: Emergency Medicine

## 2016-08-14 ENCOUNTER — Encounter (HOSPITAL_COMMUNITY): Payer: Self-pay | Admitting: Emergency Medicine

## 2016-08-14 DIAGNOSIS — R369 Urethral discharge, unspecified: Secondary | ICD-10-CM | POA: Insufficient documentation

## 2016-08-14 DIAGNOSIS — Z87891 Personal history of nicotine dependence: Secondary | ICD-10-CM | POA: Insufficient documentation

## 2016-08-14 DIAGNOSIS — Z79899 Other long term (current) drug therapy: Secondary | ICD-10-CM | POA: Diagnosis not present

## 2016-08-14 DIAGNOSIS — Q549 Hypospadias, unspecified: Secondary | ICD-10-CM | POA: Insufficient documentation

## 2016-08-14 DIAGNOSIS — Z202 Contact with and (suspected) exposure to infections with a predominantly sexual mode of transmission: Secondary | ICD-10-CM | POA: Insufficient documentation

## 2016-08-14 MED ORDER — CEFTRIAXONE SODIUM 250 MG IJ SOLR
250.0000 mg | Freq: Once | INTRAMUSCULAR | Status: AC
  Administered 2016-08-14: 250 mg via INTRAMUSCULAR

## 2016-08-14 MED ORDER — AZITHROMYCIN 250 MG PO TABS
ORAL_TABLET | ORAL | Status: AC
  Filled 2016-08-14: qty 4

## 2016-08-14 MED ORDER — CEFTRIAXONE SODIUM 250 MG IJ SOLR
INTRAMUSCULAR | Status: AC
  Filled 2016-08-14: qty 250

## 2016-08-14 MED ORDER — LIDOCAINE HCL (PF) 1 % IJ SOLN
INTRAMUSCULAR | Status: AC
  Filled 2016-08-14: qty 2

## 2016-08-14 MED ORDER — AZITHROMYCIN 250 MG PO TABS
1000.0000 mg | ORAL_TABLET | Freq: Once | ORAL | Status: AC
  Administered 2016-08-14: 1000 mg via ORAL

## 2016-08-14 NOTE — ED Notes (Signed)
Dirty urine collected. 

## 2016-08-14 NOTE — ED Provider Notes (Signed)
[facility]    CSN: [id number] Arrival date & time: 08/14/16  1342     History   Chief Complaint Chief Complaint  Patient presents with  . Exposure to STD    HPI Daniel [name] is a 20 y.o. male.   HPI He is a 20 year old man here for evaluation of STD exposure. He states his girlfriend told him she tested positive for gonorrhea and chlamydia. He reports a clear penile discharge. No dysuria, abdominal pain, or fevers. He declines HIV and syphilis testing.  Past Medical History:  Diagnosis Date  . Heart murmur     Patient Active Problem List   Diagnosis Date Noted  . 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 HERNIA REPAIR         Home Medications    Prior to Admission medications   Medication Sig Start Date End Date Taking? Authorizing Provider  HYDROcodone-acetaminophen (NORCO/VICODIN) 5-325 MG tablet Take 1 tablet by mouth every 6 (six) hours as needed. 01/10/16   Charlynne Panderavid Hsienta Yao, MD    Family History History reviewed. No pertinent family history.  Social History Social History  Substance Use Topics  . Smoking status: Former Games developermoker  . Smokeless tobacco: Never Used  . Alcohol use No     Allergies   Patient has no known allergies.   Review of Systems Review of Systems As in history of present illness  Physical Exam Triage Vital Signs ED Triage Vitals  Enc Vitals Group     BP 08/14/16 1431 134/66     Pulse Rate 08/14/16 1431 63     Resp 08/14/16 1431 16     Temp 08/14/16 1431 98.8 F (37.1 C)     Temp Source 08/14/16 1431 Oral     SpO2 08/14/16 1431 100 %     Weight --      Height --      Head Circumference --      Peak Flow --      Pain Score 08/14/16 1435 0     Pain Loc --      Pain Edu? --      Excl. in GC? --    No data found.   Updated Vital Signs BP 134/66 (BP Location: Right Arm)   Pulse 63   Temp 98.8 F (37.1 C) (Oral)   Resp 16   SpO2 100%   Visual  Acuity Right Eye Distance:   Left Eye Distance:   Bilateral Distance:    Right Eye Near:   Left Eye Near:    Bilateral Near:     Physical Exam  Constitutional: He is oriented to person, place, and time. He appears well-developed and well-nourished. No distress.  Cardiovascular: Normal rate.   Pulmonary/Chest: Effort normal.  Neurological: He is alert and oriented to person, place, and time.     UC Treatments / Results  Labs (all labs ordered are listed, but only abnormal results are displayed) Labs Reviewed  URINE CYTOLOGY ANCILLARY ONLY    EKG  EKG Interpretation None       Radiology No results found.  Procedures Procedures (including critical care time)  Medications Ordered in UC Medications  azithromycin (ZITHROMAX) tablet 1,000 mg (1,000 mg Oral Given 08/14/16 1506)  cefTRIAXone (ROCEPHIN) injection 250 mg (250 mg Intramuscular Given 08/14/16 1506)     Initial Impression / Assessment and Plan / UC Course  I have reviewed the triage vital signs and  the nursing notes.  Pertinent labs & imaging results that were available during my care of the patient were reviewed by me and considered in my medical decision making (see chart for details).  Clinical Course     We'll treat with Rocephin and azithromycin here. Urine collected for gonorrhea and chlamydia. Follow up as needed.  Final Clinical Impressions(s) / UC Diagnoses   Final diagnoses:  STD exposure    New Prescriptions New Prescriptions   No medications on file     Charm RingsErin J Lyndall Bellot, MD 08/14/16 1531
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
## 2016-08-14 NOTE — Discharge Instructions (Signed)
We will call you with the results in 2-3 days.

## 2016-08-15 LAB — URINE CYTOLOGY ANCILLARY ONLY
CHLAMYDIA, DNA PROBE: POSITIVE — AB
NEISSERIA GONORRHEA: NEGATIVE
TRICH (WINDOWPATH): NEGATIVE

## 2016-08-19 ENCOUNTER — Telehealth (HOSPITAL_COMMUNITY): Payer: Self-pay | Admitting: Emergency Medicine

## 2016-08-19 NOTE — Telephone Encounter (Signed)
-----   Message from Charm RingsErin J Honig, MD sent at 08/17/2016  5:39 PM EST ----- Please notify patient and health department of positive chlamydia.  [facility]  Follow up as needed. EH
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
## 2017-05-08 ENCOUNTER — Emergency Department (HOSPITAL_COMMUNITY)
Admission: EM | Admit: 2017-05-08 | Discharge: 2017-05-08 | Disposition: A | Discharge status: Home / Self Care | Payer: Medicaid Other | Attending: Emergency Medicine | Admitting: Emergency Medicine

## 2017-05-08 ENCOUNTER — Encounter (HOSPITAL_COMMUNITY): Payer: Self-pay

## 2017-05-08 DIAGNOSIS — Z202 Contact with and (suspected) exposure to infections with a predominantly sexual mode of transmission: Secondary | ICD-10-CM | POA: Insufficient documentation

## 2017-05-08 DIAGNOSIS — Z87891 Personal history of nicotine dependence: Secondary | ICD-10-CM | POA: Insufficient documentation

## 2017-05-08 DIAGNOSIS — R011 Cardiac murmur, unspecified: Secondary | ICD-10-CM | POA: Insufficient documentation

## 2017-05-08 LAB — URINALYSIS, ROUTINE W REFLEX MICROSCOPIC
BILIRUBIN URINE: NEGATIVE
Glucose, UA: NEGATIVE mg/dL
Hgb urine dipstick: NEGATIVE
KETONES UR: NEGATIVE mg/dL
Leukocytes, UA: NEGATIVE
Nitrite: NEGATIVE
PH: 8 (ref 5.0–8.0)
Protein, ur: NEGATIVE mg/dL
SPECIFIC GRAVITY, URINE: 1.019 (ref 1.005–1.030)

## 2017-05-08 MED ORDER — LIDOCAINE HCL (PF) 1 % IJ SOLN
2.0000 mL | Freq: Once | INTRAMUSCULAR | Status: AC
  Administered 2017-05-08: 2 mL via INTRADERMAL
  Filled 2017-05-08: qty 5

## 2017-05-08 MED ORDER — AZITHROMYCIN 250 MG PO TABS
1000.0000 mg | ORAL_TABLET | Freq: Once | ORAL | Status: AC
  Administered 2017-05-08: 1000 mg via ORAL
  Filled 2017-05-08: qty 4

## 2017-05-08 MED ORDER — METRONIDAZOLE 500 MG PO TABS
2000.0000 mg | ORAL_TABLET | Freq: Once | ORAL | Status: AC
  Administered 2017-05-08: 2000 mg via ORAL
  Filled 2017-05-08: qty 4

## 2017-05-08 MED ORDER — CEFTRIAXONE SODIUM 250 MG IJ SOLR
250.0000 mg | Freq: Once | INTRAMUSCULAR | Status: AC
  Administered 2017-05-08: 250 mg via INTRAMUSCULAR
  Filled 2017-05-08: qty 250

## 2017-05-08 MED ORDER — ONDANSETRON 4 MG PO TBDP
4.0000 mg | ORAL_TABLET | Freq: Once | ORAL | Status: AC
  Administered 2017-05-08: 4 mg via ORAL
  Filled 2017-05-08: qty 1

## 2017-05-08 NOTE — ED Provider Notes (Signed)
[facility]   CSN: [id number] Arrival date & time: 05/08/17  2100  History   Chief Complaint Chief Complaint  Patient presents with  . Exposure to STD    HPI Daniel [name] is a 21 y.o. male presents to the ED for STD testing and treatment. States he had sexual intercourse 5 days ago and felt the condom break. Unfortunately, his partner told him she had gonorrhea. Patient requesting testing and treatment. Denies dysuria, penile discharge, testicular pain, genital sores, fevers, chills. HPI  Past Medical History:  Diagnosis Date  . Heart murmur     Patient Active Problem List   Diagnosis Date Noted  . 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 HERNIA REPAIR         Home Medications    Prior to Admission medications   Medication Sig Start Date End Date Taking? Authorizing Provider  HYDROcodone-acetaminophen (NORCO/VICODIN) 5-325 MG tablet Take 1 tablet by mouth every 6 (six) hours as needed. 01/10/16   Charlynne Pander, MD    Family History No family history on file.  Social History Social History  Substance Use Topics  . Smoking status: Former Games developer  . Smokeless tobacco: Never Used  . Alcohol use No     Allergies   Patient has no known allergies.   Review of Systems Review of Systems  Constitutional: Negative for fever.  Genitourinary: Negative for difficulty urinating, discharge, dysuria, hematuria, penile pain, penile swelling, scrotal swelling and testicular pain.  Musculoskeletal: Negative for myalgias.  Skin: Negative for color change.     Physical Exam Updated Vital Signs BP 138/77 (BP Location: Right Arm)   Pulse 66   Temp 97.9 F (36.6 C) (Oral)   Resp 16   SpO2 100%   Physical Exam  Constitutional: He is oriented to person, place, and time. He appears well-developed and well-nourished. No distress.  NAD.  HENT:  Head: Normocephalic and atraumatic.  Right Ear:  External ear normal.  Left Ear: External ear normal.  Nose: Nose normal.  No intraoral perioral lesions oropharynx and tonsils normal  Eyes: Conjunctivae and EOM are normal. No scleral icterus.  Neck: Normal range of motion. Neck supple.  Cardiovascular: Normal rate, regular rhythm, normal heart sounds and intact distal pulses.   No murmur heard. Pulmonary/Chest: Effort normal and breath sounds normal. He has no wheezes.  Genitourinary:  Genitourinary Comments: External genitalia normal without erythema, edema, tenderness or lesions.  Circumcised male.  No groin lymphadenopathy.  No meatus discharge.  Glans and shaft smooth without tenderness, lesions, masses or deformity.  Scrotum without lesions or edema.  Non tender testicles. Epididymis and spermatic cord without tenderness or masses, bilaterally. Cremasteric reflex intact.  Musculoskeletal: Normal range of motion. He exhibits no deformity.  Neurological: He is alert and oriented to person, place, and time.  Skin: Skin is warm and dry. Capillary refill takes less than 2 seconds.  Psychiatric: He has a normal mood and affect. His behavior is normal. Judgment and thought content normal.  Nursing note and vitals reviewed.    ED Treatments / Results  Labs (all labs ordered are listed, but only abnormal results are displayed) Labs Reviewed  URINALYSIS, ROUTINE W REFLEX MICROSCOPIC  GC/CHLAMYDIA PROBE AMP ([place]) NOT AT [facility]    EKG  EKG Interpretation None       Radiology No results found.  Procedures Procedures (including critical care time)  Medications Ordered in ED Medications  cefTRIAXone (ROCEPHIN) injection 250 mg (250 mg Intramuscular Given 05/08/17 2254)  metroNIDAZOLE (FLAGYL) tablet 2,000 mg (2,000 mg Oral Given 05/08/17 2255)  azithromycin (ZITHROMAX) tablet 1,000 mg (1,000 mg Oral Given 05/08/17 2254)  ondansetron (ZOFRAN-ODT) disintegrating tablet 4 mg (4 mg Oral Given 05/08/17 2255)  lidocaine (PF)  (XYLOCAINE) 1 % injection 2 mL (2 mLs Intradermal Given 05/08/17 2255)     Initial Impression / Assessment and Plan / ED Course  I have reviewed the triage vital signs and the nursing notes.  Pertinent labs & imaging results that were available during my care of the patient were reviewed by me and considered in my medical decision making (see chart for details).     21 year old male presents to ED for STD testing and treatment after known exposure to gonorrhea. Asymptomatic. Exam is normal. Urinalysis without UTI. Pending gonorrhea and chlamydia testing. Patient was empirically treated for STDs. Educated patient on states sex practices. He is aware he needs to notify all his sexual partners and avoid sexual intercourse for 7 days after tx. he verbalized understanding and is agreeable with ED treatment and discharge plan.  Final Clinical Impressions(s) / ED Diagnoses   Final diagnoses:  STD exposure    New Prescriptions Discharge Medication List as of 05/08/2017 11:52 PM       Liberty Handy, PA-C 05/09/17 1610    Mancel Bale, MD 05/10/17 1549

## 2017-05-08 NOTE — Discharge Instructions (Signed)
You were treated for gonorrhea, chlamydia and Trichomonas today. You will be contacted if your STD testing is positive within 2-3 days via phone call.  Return to the ED if you have lower abdominal pain, burning with urination, blood in urine, penile discharge, testicular pain, fever.

## 2017-05-08 NOTE — ED Triage Notes (Signed)
Pt reports he had unprotected sex with male that he was told had gonorrhea  And wants to be checked. PT reports burning with urination but no other symptoms.

## 2017-05-09 LAB — GC/CHLAMYDIA PROBE AMP (~~LOC~~) NOT AT ARMC
CHLAMYDIA, DNA PROBE: NEGATIVE
Neisseria Gonorrhea: POSITIVE — AB

## 2017-06-25 ENCOUNTER — Emergency Department (HOSPITAL_COMMUNITY)
Admission: EM | Admit: 2017-06-25 | Discharge: 2017-06-26 | Disposition: A | Discharge status: Home / Self Care | Payer: Self-pay | Attending: Emergency Medicine | Admitting: Emergency Medicine

## 2017-06-25 ENCOUNTER — Encounter (HOSPITAL_COMMUNITY): Payer: Self-pay | Admitting: Emergency Medicine

## 2017-06-25 DIAGNOSIS — Z87891 Personal history of nicotine dependence: Secondary | ICD-10-CM | POA: Insufficient documentation

## 2017-06-25 DIAGNOSIS — X58XXXA Exposure to other specified factors, initial encounter: Secondary | ICD-10-CM | POA: Insufficient documentation

## 2017-06-25 DIAGNOSIS — Y939 Activity, unspecified: Secondary | ICD-10-CM | POA: Insufficient documentation

## 2017-06-25 DIAGNOSIS — Y999 Unspecified external cause status: Secondary | ICD-10-CM | POA: Insufficient documentation

## 2017-06-25 DIAGNOSIS — S161XXA Strain of muscle, fascia and tendon at neck level, initial encounter: Secondary | ICD-10-CM | POA: Insufficient documentation

## 2017-06-25 DIAGNOSIS — Y929 Unspecified place or not applicable: Secondary | ICD-10-CM | POA: Insufficient documentation

## 2017-06-25 NOTE — ED Triage Notes (Signed)
Patient reports left lateral neck pain radiating to left shoulder/left upper arm onset 3 weeks ago , denies injury or fall , pain increases when facing to right side , pt. stated he turned too fast 3 weeks ago when pain started.

## 2017-06-26 MED ORDER — METHOCARBAMOL 500 MG PO TABS: 500.0000 mg | ORAL_TABLET | Freq: Two times a day (BID) | ORAL | 0 refills | Status: DC | PRN

## 2017-06-26 NOTE — ED Provider Notes (Signed)
[facility]   CSN: [id number] Arrival date & time: 06/25/17  2034     History   Chief Complaint Chief Complaint  Patient presents with  . Neck Pain    HPI Daniel [name] is a 21 y.o. male.  The history is provided by the patient and medical records. No language interpreter was used.  Neck Pain     Daniel [name] is a 21 y.o. male  with no pertinent PMH who presents to the Emergency Department complaining of left-sided neck pain for the last 3 weeks. Patient states that he initially looked to his right really quickly and felt a pull. Since that time, he has had nagging aching left-sided neck pain. He has tried ibuprofen, Tylenol and BC powder without improvement. Patient states that pain is worse when he turns his head to the right. When he does turn his head to the right, he feels the pain radiates to his left shoulder. No other aggravating factors noted. No history of similar sxs. No fever, chills or recent illness. No numbness or tingling. No weakness in the arms.  Past Medical History:  Diagnosis Date  . Heart murmur     Patient Active Problem List   Diagnosis Date Noted  . 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 HERNIA REPAIR         Home Medications    Prior to Admission medications   Medication Sig Start Date End Date Taking? Authorizing Provider  HYDROcodone-acetaminophen (NORCO/VICODIN) 5-325 MG tablet Take 1 tablet by mouth every 6 (six) hours as needed. Patient not taking: Reported on 06/26/2017 01/10/16   Charlynne Pander, MD  methocarbamol (ROBAXIN) 500 MG tablet Take 1 tablet (500 mg total) by mouth 2 (two) times daily as needed for muscle spasms. 06/26/17   Ward, Chase Picket, PA-C    Family History No family history on file.  Social History Social History  Substance Use Topics  . Smoking status: Former Games developer  . Smokeless tobacco: Never Used  . Alcohol use No      Allergies   Patient has no known allergies.   Review of Systems Review of Systems  Musculoskeletal: Positive for myalgias and neck pain.  All other systems reviewed and are negative.    Physical Exam Updated Vital Signs BP 132/76 (BP Location: Right Arm)   Pulse 62   Temp 98.6 F (37 C) (Oral)   Resp 17   SpO2 96%   Physical Exam  Constitutional: He is oriented to person, place, and time. He appears well-developed and well-nourished. No distress.  HENT:  Head: Normocephalic and atraumatic.  Neck:  No midline cervical tenderness. Tenderness to palpation of the left paraspinal musculature without visible spasm. Full range of motion, however pain is reproduced when turning head to the right.  Cardiovascular: Normal rate, regular rhythm and normal heart sounds.   No murmur heard. Pulmonary/Chest: Effort normal and breath sounds normal. No respiratory distress.  Abdominal: Soft.  Musculoskeletal: He exhibits no edema.  Neurological: He is alert and oriented to person, place, and time.  Skin: Skin is warm and dry.  Nursing note and vitals reviewed.    ED Treatments / Results  Labs (all labs ordered are listed, but only abnormal results are displayed) Labs Reviewed - No data to display  EKG  EKG Interpretation None       Radiology No results found.  Procedures Procedures (including critical care time)  Medications Ordered in ED Medications - No data to display   Initial Impression / Assessment and Plan / ED Course  I have reviewed the triage vital signs and the nursing notes.  Pertinent labs & imaging results that were available during my care of the patient were reviewed by me and considered in my medical decision making (see chart for details).    Daniel [name] is an otherwise healthy 21 year old male who presents to ED for neck pain consistent with muscle strain. No midline cervical tenderness. Afebrile with no meningeal signs. Will treat with  muscle relaxers and OTC NSAIDs. Symptomatic home care instructions discussed. PCP follow-up if no improvement. Return precautions discussed and all questions answered.   Final Clinical Impressions(s) / ED Diagnoses   Final diagnoses:  Strain of neck muscle, initial encounter    New Prescriptions New Prescriptions   METHOCARBAMOL (ROBAXIN) 500 MG TABLET    Take 1 tablet (500 mg total) by mouth 2 (two) times daily as needed for muscle spasms.     Ward, Chase Picket, PA-C 06/26/17 Rich Fuchs    Zadie Rhine, MD 06/26/17 [phone]

## 2017-06-26 NOTE — Discharge Instructions (Signed)
It was my pleasure taking care of you today!   Ibuprofen as needed for pain. You can take up to 3 over-the-counter tablets (600 mg) every 8 hours. Robaxin is your muscle relaxer to take as needed. Ice or heat to the area 2-3 times per day.   Follow up with your primary care provider if symptoms are not improved in the next 5-7 days.  Return to ER for new or worsening symptoms, any additional concerns.

## 2018-05-20 ENCOUNTER — Emergency Department (HOSPITAL_COMMUNITY)
Admission: EM | Admit: 2018-05-20 | Discharge: 2018-05-20 | Disposition: A | Discharge status: Home / Self Care | Payer: Self-pay | Attending: Emergency Medicine | Admitting: Emergency Medicine

## 2018-05-20 ENCOUNTER — Other Ambulatory Visit: Payer: Self-pay

## 2018-05-20 ENCOUNTER — Encounter (HOSPITAL_COMMUNITY): Payer: Self-pay | Admitting: Emergency Medicine

## 2018-05-20 DIAGNOSIS — Z202 Contact with and (suspected) exposure to infections with a predominantly sexual mode of transmission: Secondary | ICD-10-CM | POA: Insufficient documentation

## 2018-05-20 DIAGNOSIS — Z87891 Personal history of nicotine dependence: Secondary | ICD-10-CM | POA: Insufficient documentation

## 2018-05-20 DIAGNOSIS — R369 Urethral discharge, unspecified: Secondary | ICD-10-CM | POA: Insufficient documentation

## 2018-05-20 LAB — URINALYSIS, ROUTINE W REFLEX MICROSCOPIC
BILIRUBIN URINE: NEGATIVE
GLUCOSE, UA: NEGATIVE mg/dL
HGB URINE DIPSTICK: NEGATIVE
KETONES UR: NEGATIVE mg/dL
Leukocytes, UA: NEGATIVE
Nitrite: NEGATIVE
PROTEIN: NEGATIVE mg/dL
Specific Gravity, Urine: 1.009 (ref 1.005–1.030)
pH: 7 (ref 5.0–8.0)

## 2018-05-20 MED ORDER — AZITHROMYCIN 250 MG PO TABS
1000.0000 mg | ORAL_TABLET | Freq: Once | ORAL | Status: AC
  Administered 2018-05-20: 1000 mg via ORAL
  Filled 2018-05-20: qty 4

## 2018-05-20 MED ORDER — CEFTRIAXONE SODIUM 250 MG IJ SOLR
250.0000 mg | Freq: Once | INTRAMUSCULAR | Status: AC
  Administered 2018-05-20: 250 mg via INTRAMUSCULAR
  Filled 2018-05-20: qty 250

## 2018-05-20 MED ORDER — LIDOCAINE HCL (PF) 1 % IJ SOLN
5.0000 mL | Freq: Once | INTRAMUSCULAR | Status: AC
  Administered 2018-05-20: 5 mL
  Filled 2018-05-20: qty 5

## 2018-05-20 NOTE — ED Provider Notes (Signed)
[facility] EMERGENCY DEPARTMENT Provider Note   CSN: [id number] Arrival date & time: 05/20/18  1723     History   Chief Complaint Chief Complaint  Patient presents with  . Exposure to STD    HPI Daniel [name] is a 22 y.o. male.  Patient presents to ED with concern for exposure to STD. Condom failure during sexual activity several days ago. He has recently developed a clear penile discharge. No genital rash or sores. No testicular pain.  The history is provided by the patient. No language interpreter was used.  Exposure to STD  This is a new problem. The current episode started 2 days ago.    Past Medical History:  Diagnosis Date  . Heart murmur     Patient Active Problem List   Diagnosis Date Noted  . 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 HERNIA REPAIR          Home Medications    Prior to Admission medications   Medication Sig Start Date End Date Taking? Authorizing Provider  HYDROcodone-acetaminophen (NORCO/VICODIN) 5-325 MG tablet Take 1 tablet by mouth every 6 (six) hours as needed. Patient not taking: Reported on 06/26/2017 01/10/16   Charlynne Pander, MD  methocarbamol (ROBAXIN) 500 MG tablet Take 1 tablet (500 mg total) by mouth 2 (two) times daily as needed for muscle spasms. 06/26/17   Ward, Chase Picket, PA-C    Family History No family history on file.  Social History Social History   Tobacco Use  . Smoking status: Former Games developer  . Smokeless tobacco: Never Used  Substance Use Topics  . Alcohol use: No  . Drug use: Yes    Types: Marijuana     Allergies   Patient has no known allergies.   Review of Systems Review of Systems  Genitourinary: Positive for discharge. Negative for genital sores and testicular pain.  All other systems reviewed and are negative.    Physical Exam Updated Vital Signs BP 132/89 (BP Location: Right Arm)   Pulse (!) 56   Temp 99.1  F (37.3 C) (Oral)   Resp 14   Ht 5\' 8"  (1.727 m)   Wt 70.3 kg   SpO2 100%   BMI 23.57 kg/m   Physical Exam  Constitutional: He is oriented to person, place, and time. He appears well-developed and well-nourished.  HENT:  Head: Atraumatic.  Eyes: Conjunctivae are normal.  Neck: Neck supple.  Cardiovascular: Normal rate and regular rhythm.  Pulmonary/Chest: Effort normal and breath sounds normal.  Abdominal: Soft. Bowel sounds are normal.  Genitourinary: Testes normal. Circumcised. Hypospadias present. No penile erythema. No discharge found.  Musculoskeletal: Normal range of motion.  Neurological: He is alert and oriented to person, place, and time.  Skin: Skin is warm and dry.  Psychiatric: He has a normal mood and affect.  Nursing note and vitals reviewed.    ED Treatments / Results  Labs (all labs ordered are listed, but only abnormal results are displayed) Labs Reviewed  URINALYSIS, ROUTINE W REFLEX MICROSCOPIC - Abnormal; Notable for the following components:      Result Value   Color, Urine STRAW (*)    All other components within normal limits  RPR  HIV ANTIBODY (ROUTINE TESTING)  GC/CHLAMYDIA PROBE AMP ([place]) NOT AT [facility]    EKG None  Radiology No results found.  Procedures Procedures (including critical care time)  Medications Ordered in ED Medications  cefTRIAXone (  ROCEPHIN) injection 250 mg (250 mg Intramuscular Given 05/20/18 2057)  azithromycin (ZITHROMAX) tablet 1,000 mg (1,000 mg Oral Given 05/20/18 2057)  lidocaine (PF) (XYLOCAINE) 1 % injection 5 mL (5 mLs Other Given 05/20/18 2057)     Initial Impression / Assessment and Plan / ED Course  I have reviewed the triage vital signs and the nursing notes.  Pertinent labs & imaging results that were available during my care of the patient were reviewed by me and considered in my medical decision making (see chart for details).     Patient treated in the ED for STI with rocephin and  azithromycin. Patient advised to inform and treat all sexual partners.  Pt advised on safe sex practices and understands that they have GC/Chlamydia cultures pending and will result in 2-3 days. HIV and RPR sent. Pt encouraged to follow up at local health department for future STI checks. No concern for prostatitis or epididymitis. Discussed return precautions. Pt appears safe for discharge.   Final Clinical Impressions(s) / ED Diagnoses   Final diagnoses:  Possible exposure to STD    ED Discharge Orders    None       Felicie Morn, NP 05/20/18 2103    Rolan Bucco, MD 05/20/18 2302

## 2018-05-20 NOTE — ED Triage Notes (Signed)
Patient to ED c/o new onset clear penile discharge today after having unprotected sex with new partner. Denies pain or urinary symptoms.

## 2018-05-20 NOTE — Discharge Instructions (Signed)
Please follow-up with the STD clinic at the health department.

## 2018-05-20 NOTE — ED Notes (Cosign Needed)
8:13 PM Patient not in room.   Felicie Morn, NP 05/20/18 2128

## 2018-05-21 LAB — HIV ANTIBODY (ROUTINE TESTING W REFLEX): HIV Screen 4th Generation wRfx: NONREACTIVE

## 2018-05-21 LAB — RPR: RPR: NONREACTIVE

## 2018-10-31 ENCOUNTER — Ambulatory Visit (HOSPITAL_COMMUNITY)
Admission: EM | Admit: 2018-10-31 | Discharge: 2018-10-31 | Disposition: A | Discharge status: Home / Self Care | Payer: Self-pay | Attending: Family Medicine | Admitting: Family Medicine

## 2018-10-31 ENCOUNTER — Encounter (HOSPITAL_COMMUNITY): Payer: Self-pay | Admitting: Emergency Medicine

## 2018-10-31 DIAGNOSIS — Z202 Contact with and (suspected) exposure to infections with a predominantly sexual mode of transmission: Secondary | ICD-10-CM | POA: Insufficient documentation

## 2018-10-31 DIAGNOSIS — Z113 Encounter for screening for infections with a predominantly sexual mode of transmission: Secondary | ICD-10-CM | POA: Insufficient documentation

## 2018-10-31 MED ORDER — CEFTRIAXONE SODIUM 250 MG IJ SOLR
INTRAMUSCULAR | Status: AC
  Filled 2018-10-31: qty 250

## 2018-10-31 MED ORDER — AZITHROMYCIN 250 MG PO TABS
ORAL_TABLET | ORAL | Status: AC
  Filled 2018-10-31: qty 4

## 2018-10-31 MED ORDER — AZITHROMYCIN 250 MG PO TABS
1000.0000 mg | ORAL_TABLET | Freq: Once | ORAL | Status: AC
  Administered 2018-10-31: 1000 mg via ORAL

## 2018-10-31 MED ORDER — CEFTRIAXONE SODIUM 250 MG IJ SOLR
250.0000 mg | Freq: Once | INTRAMUSCULAR | Status: AC
  Administered 2018-10-31: 250 mg via INTRAMUSCULAR

## 2018-10-31 NOTE — ED Triage Notes (Signed)
Pt states he was with someone who stated they might have gonorrhea. Denies symptoms.

## 2018-10-31 NOTE — Discharge Instructions (Addendum)
Given rocephin 250mg  injection and azithromycin 1g in office Urine cytology obtained  Declines HIV/ syphilis testing today We will follow up with you regarding the results of your test If tests are positive, please abstain from sexual activity for at least 7 days and notify partners Follow up with PCP if symptoms persists Return here or go to ER if you have any new or worsening symptoms such as fever, chills, nausea, vomiting, abdominal or pelvic pain, penile rashes or lesions, testicular swelling or pain, etc..Marland Kitchen

## 2018-10-31 NOTE — ED Provider Notes (Signed)
[facility]   [id number] 10/31/18 Arrival Time: 1515   CC: CONCERN FOR STD  SUBJECTIVE:  Daniel [name] is a 23 y.o. male who presents requesting STI screening.  Currently asymptomatic. Partner with possible diagnosis of gonorrhea.  Last unprotected sexual encounter last week.  Sexually active with 1 male partner.  Denies similar symptoms in the past.  Denies fever, chills, nausea, vomiting, abdominal or pelvic pain, penile rashes or lesions, testicular swelling or pain.     No LMP for male patient.  ROS: As per HPI.  Past Medical History:  Diagnosis Date  . Heart murmur    Past Surgical History:  Procedure Laterality Date  . HERNIA REPAIR     No Known Allergies No current facility-administered medications on file prior to encounter.    No current outpatient medications on file prior to encounter.   Social History   Socioeconomic History  . Marital status: Single    Spouse name: Not on file  . Number of children: Not on file  . Years of education: Not on file  . Highest education level: Not on file  Occupational History  . Not on file  Social Needs  . Financial resource strain: Not on file  . Food insecurity:    Worry: Not on file    Inability: Not on file  . Transportation needs:    Medical: Not on file    Non-medical: Not on file  Tobacco Use  . Smoking status: Former Games developer  . Smokeless tobacco: Never Used  Substance and Sexual Activity  . Alcohol use: No  . Drug use: Yes    Types: Marijuana  . Sexual activity: Not on file  Lifestyle  . Physical activity:    Days per week: Not on file    Minutes per session: Not on file  . Stress: Not on file  Relationships  . Social connections:    Talks on phone: Not on file    Gets together: Not on file    Attends religious service: Not on file    Active member of club or organization: Not on file    Attends meetings of clubs or organizations: Not on file    Relationship status: Not on file  .  Intimate partner violence:    Fear of current or ex partner: Not on file    Emotionally abused: Not on file    Physically abused: Not on file    Forced sexual activity: Not on file  Other Topics Concern  . Not on file  Social History Narrative  . Not on file   No family history on file.  OBJECTIVE:  Vitals:   10/31/18 1528  BP: 115/66  Pulse: (!) 56  Resp: 18  Temp: 98 F (36.7 C)  SpO2: 99%     General appearance: alert, NAD, appears stated age Head: NCAT Throat: lips, mucosa, and tongue normal; teeth and gums normal Lungs: CTA bilaterally without adventitious breath sounds Heart: regular rate and rhythm.  Radial pulses 2+ symmetrical bilaterally Back: no CVA tenderness Abdomen: soft, non-tender; bowel sounds normal; no guarding GU: deferred; urine cytology obtained Skin: warm and dry Psychological:  Alert and cooperative. Normal mood and affect.  ASSESSMENT & PLAN:  1. Screen for STD (sexually transmitted disease)   2. Possible exposure to STD     Meds ordered this encounter  Medications  . cefTRIAXone (ROCEPHIN) injection 250 mg    Order Specific Question:   Antibiotic Indication:    Answer:  STD  . azithromycin (ZITHROMAX) tablet 1,000 mg    Pending: Labs Reviewed  URINE CYTOLOGY ANCILLARY ONLY    Given rocephin 250mg  injection and azithromycin 1g in office Urine cytology obtained  Declines HIV/ syphilis testing today We will follow up with you regarding the results of your test If tests are positive, please abstain from sexual activity for at least 7 days and notify partners Follow up with PCP if symptoms persists Return here or go to ER if you have any new or worsening symptoms such as fever, chills, nausea, vomiting, abdominal or pelvic pain, penile rashes or lesions, testicular swelling or pain, etc...  Reviewed expectations re: course of current medical issues. Questions answered. Outlined signs and symptoms indicating need for more acute  intervention. Patient verbalized understanding. After Visit Summary given.       Rennis Harding, PA-C 10/31/18 1544

## 2018-11-03 LAB — URINE CYTOLOGY ANCILLARY ONLY
Chlamydia: POSITIVE — AB
Neisseria Gonorrhea: POSITIVE — AB
TRICH (WINDOWPATH): NEGATIVE

## 2018-11-07 ENCOUNTER — Telehealth (HOSPITAL_COMMUNITY): Payer: Self-pay | Admitting: Emergency Medicine

## 2018-11-07 NOTE — Telephone Encounter (Signed)
Patient contacted and made aware of all results, all questions answered.   

## 2018-11-07 NOTE — Telephone Encounter (Signed)
Chlamydia is positive.  This was treated at the urgent care visit with po zithromax 1g.  Pt needs education to please refrain from sexual intercourse for 7 days to give the medicine time to work.  Sexual partners need to be notified and tested/treated.  Condoms may reduce risk of reinfection.  Recheck or followup with PCP for further evaluation if symptoms are not improving.  [facility] notified.  Test for gonorrhea was positive. This was treated at the urgent care visit with IM rocephin 250mg and po zithromax 1g. Pt needs education to refrain from sexual intercourse for 7 days after treatment to give the medicine time to work. Sexual partners need to be notified and tested/treated. Condoms may reduce risk of reinfection. Recheck or followup with PCP for further evaluation if symptoms are not improving. [facility] notified.   Attempted to reach patient. No answer at this time. Voicemail left.    

## 2019-03-06 ENCOUNTER — Other Ambulatory Visit: Payer: Self-pay

## 2019-03-06 ENCOUNTER — Encounter (HOSPITAL_COMMUNITY): Payer: Self-pay

## 2019-03-06 ENCOUNTER — Ambulatory Visit (HOSPITAL_COMMUNITY)
Admission: EM | Admit: 2019-03-06 | Discharge: 2019-03-06 | Disposition: A | Discharge status: Home / Self Care | Payer: Self-pay | Attending: Internal Medicine | Admitting: Internal Medicine

## 2019-03-06 DIAGNOSIS — Z202 Contact with and (suspected) exposure to infections with a predominantly sexual mode of transmission: Secondary | ICD-10-CM | POA: Insufficient documentation

## 2019-03-06 MED ORDER — CEFTRIAXONE SODIUM 250 MG IJ SOLR
250.0000 mg | Freq: Once | INTRAMUSCULAR | Status: AC
  Administered 2019-03-06: 250 mg via INTRAMUSCULAR

## 2019-03-06 MED ORDER — AZITHROMYCIN 250 MG PO TABS
ORAL_TABLET | ORAL | Status: AC
  Filled 2019-03-06: qty 4

## 2019-03-06 MED ORDER — AZITHROMYCIN 250 MG PO TABS
1000.0000 mg | ORAL_TABLET | Freq: Once | ORAL | Status: AC
  Administered 2019-03-06: 16:00:00 1000 mg via ORAL

## 2019-03-06 MED ORDER — CEFTRIAXONE SODIUM 250 MG IJ SOLR
INTRAMUSCULAR | Status: AC
  Filled 2019-03-06: qty 250

## 2019-03-06 MED ORDER — STERILE WATER FOR INJECTION IJ SOLN
INTRAMUSCULAR | Status: AC
  Filled 2019-03-06: qty 10

## 2019-03-06 NOTE — ED Provider Notes (Signed)
[place]    CSN: [id number] Arrival date & time: 03/06/19  1514     History   Chief Complaint Chief Complaint  Patient presents with  . Exposure to STD    HPI DYWANE PERUSKI is a 23 y.o. 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  Patient currently sexually active with 1 male partner, not wearing condoms.  Of note, patient contracted chlamydia and gonorrhea from same partner prior to recent incarceration.  She was released few weeks ago, was incarcerated for 3 months.  States that he was tested for HIV, hepatitis at that time, declined screening today.  Requesting treatment for G/C as was previously done at February appointment.  Patient denies penile pain, testicular pain, penile discharge, dysuria, anogenital lesions, fever.   Past Medical History:  Diagnosis Date  . Heart murmur     Patient Active Problem List   Diagnosis Date Noted  . 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 HERNIA REPAIR         Home Medications    Prior to Admission medications   Not on File    Family History Family History  Family history unknown: Yes    Social History Social History   Tobacco Use  . Smoking status: Former Research scientist (life sciences)  . Smokeless tobacco: Never Used  Substance Use Topics  . Alcohol use: No  . Drug use: Yes    Types: Marijuana     Allergies   Patient has no known allergies.   Review of Systems As per HPI   Physical Exam Triage Vital Signs ED Triage Vitals  Enc Vitals Group     BP 03/06/19 1603 132/79     Pulse Rate 03/06/19 1603 68     Resp 03/06/19 1603 18     Temp 03/06/19 1603 98.1 F (36.7 C)     Temp Source 03/06/19 1603 Oral     SpO2 03/06/19 1603 96 %     Weight --      Height --      Head Circumference --      Peak Flow --      Pain Score 03/06/19 1604 0     Pain Loc --      Pain Edu? --      Excl. in [place]? --    No data found.  Updated  Vital Signs BP 132/79 (BP Location: Right Arm)   Pulse 68   Temp 98.1 F (36.7 C) (Oral)   Resp 18   SpO2 96%   Visual Acuity Right Eye Distance:   Left Eye Distance:   Bilateral Distance:    Right Eye Near:   Left Eye Near:    Bilateral Near:     Physical Exam Constitutional:      General: He is not in acute distress. HENT:     Head: Normocephalic and atraumatic.  Eyes:     General: No scleral icterus.    Pupils: Pupils are equal, round, and reactive to light.  Cardiovascular:     Rate and Rhythm: Normal rate.  Pulmonary:     Effort: Pulmonary effort is normal.  Skin:    Coloration: Skin is not jaundiced or pale.  Neurological:     Mental Status: He is alert and oriented to person, place, and time.      UC Treatments / Results  Labs (all labs ordered are listed, but only abnormal results are  displayed) Labs Reviewed  URINE CYTOLOGY ANCILLARY ONLY    EKG None  Radiology No results found.  Procedures Procedures (including critical care time)  Medications Ordered in UC Medications  cefTRIAXone (ROCEPHIN) injection 250 mg (250 mg Intramuscular Given 03/06/19 1625)  azithromycin (ZITHROMAX) tablet 1,000 mg (1,000 mg Oral Given 03/06/19 1625)  azithromycin (ZITHROMAX) 250 MG tablet (has no administration in time range)  sterile water (preservative free) injection (has no administration in time range)  cefTRIAXone (ROCEPHIN) 250 MG injection (has no administration in time range)    Initial Impression / Assessment and Plan / UC Course  I have reviewed the triage vital signs and the nursing notes.  Pertinent labs & imaging results that were available during my care of the patient were reviewed by me and considered in my medical decision making (see chart for details).     23 year old male with right history of gonococcal infection presenting for STD exposure.  Patient treated with azithromycin and ceftriaxone in office.  Discussed preventative measures, as  well as abstaining from all forms of intercourse for the next 7 days, and/or after his partner received treatment.  Patient verbalized understanding. Final Clinical Impressions(s) / UC Diagnoses   Final diagnoses:  STD exposure     Discharge Instructions     Avoid all forms of sexual intercourse (oral, vaginal, anal) for the next 7 days to avoid spreading/reinfecting. Return if symptoms worsen/do not resolve, you develop fever, abdominal pain, blood in your urine, or are re-exposed to an STI.    ED Prescriptions    None     Controlled Substance Prescriptions [place] Controlled Substance Registry consulted? Not Applicable   Shea EvansHall-Potvin, , [place] JerseyPA-C 03/06/19 1901

## 2019-03-06 NOTE — Discharge Instructions (Signed)
Avoid all forms of sexual intercourse (oral, vaginal, anal) for the next 7 days to avoid spreading/reinfecting. Return if symptoms worsen/do not resolve, you develop fever, abdominal pain, blood in your urine, or are re-exposed to an STI.  

## 2019-03-06 NOTE — ED Triage Notes (Signed)
Pt presents for STD Testing after possible exposure.

## 2019-03-09 LAB — URINE CYTOLOGY ANCILLARY ONLY
Chlamydia: NEGATIVE
Neisseria Gonorrhea: NEGATIVE
Trichomonas: NEGATIVE

## 2020-04-22 ENCOUNTER — Ambulatory Visit (HOSPITAL_COMMUNITY)
Admission: RE | Admit: 2020-04-22 | Discharge: 2020-04-22 | Disposition: A | Discharge status: Home / Self Care | Payer: Medicaid Other | Source: Ambulatory Visit | Attending: Family Medicine | Admitting: Family Medicine

## 2020-04-22 ENCOUNTER — Encounter (HOSPITAL_COMMUNITY): Payer: Self-pay

## 2020-04-22 ENCOUNTER — Other Ambulatory Visit: Payer: Self-pay

## 2020-04-22 VITALS — BP 129/77 | HR 68 | Temp 98.1°F | Resp 15

## 2020-04-22 DIAGNOSIS — R509 Fever, unspecified: Secondary | ICD-10-CM | POA: Diagnosis present

## 2020-04-22 DIAGNOSIS — U071 COVID-19: Secondary | ICD-10-CM | POA: Diagnosis not present

## 2020-04-22 DIAGNOSIS — Z87891 Personal history of nicotine dependence: Secondary | ICD-10-CM | POA: Diagnosis not present

## 2020-04-22 DIAGNOSIS — B349 Viral infection, unspecified: Secondary | ICD-10-CM

## 2020-04-22 MED ORDER — IBUPROFEN 800 MG PO TABS: 800.0000 mg | ORAL_TABLET | Freq: Three times a day (TID) | ORAL | 0 refills | Status: DC

## 2020-04-22 MED ORDER — FLUTICASONE PROPIONATE 50 MCG/ACT NA SUSP: 1.0000 | Freq: Every day | NASAL | 0 refills | Status: DC

## 2020-04-22 NOTE — Discharge Instructions (Addendum)
COVID test pending Ibuprofen and Tylenol for fever body aches, headache Rest and fluids Flonase for congestion May continue DayQuil/NyQuil  Follow-up if not improving or worsening

## 2020-04-22 NOTE — ED Triage Notes (Addendum)
Pt c/o fever of 101 today and chills. He denies any other symptoms. He states had cold symptoms earlier in the week but they resolved after taking Dayquil.

## 2020-04-23 LAB — SARS CORONAVIRUS 2 (TAT 6-24 HRS): SARS Coronavirus 2: POSITIVE — AB

## 2020-04-23 NOTE — ED Provider Notes (Signed)
[facility]    CSN: [id number] Arrival date & time: 04/22/20  1704      History   Chief Complaint Chief Complaint  Patient presents with  . Appointment  . Fever    HPI Daniel [name] is a 24 y.o. male presenting today for evaluation of fever and chills. Patient reports that over the past 2 days he has had some mild URI symptoms which has been taking DayQuil for. He also reports fever up to 101 earlier today and chills. He denies any chest pain or shortness of breath. Reports significant other also with similar symptoms.  HPI  Past Medical History:  Diagnosis Date  . Heart murmur     Patient Active Problem List   Diagnosis Date Noted  . 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 HERNIA REPAIR         Home Medications    Prior to Admission medications   Medication Sig Start Date End Date Taking? Authorizing Provider  fluticasone (FLONASE) 50 MCG/ACT nasal spray Place 1-2 sprays into both nostrils daily for 7 days. 04/22/20 04/29/20  Samiyyah Moffa C, PA-C  ibuprofen (ADVIL) 800 MG tablet Take 1 tablet (800 mg total) by mouth 3 (three) times daily. 04/22/20   Raya Mckinstry, Junius Creamer, PA-C    Family History Family History  Problem Relation Age of Onset  . Healthy Mother   . Healthy Father     Social History Social History   Tobacco Use  . Smoking status: Former Games developer  . Smokeless tobacco: Never Used  Substance Use Topics  . Alcohol use: No  . Drug use: Yes    Types: Marijuana     Allergies   Patient has no known allergies.   Review of Systems Review of Systems  Constitutional: Positive for chills and fever. Negative for activity change, appetite change and fatigue.  HENT: Positive for congestion and rhinorrhea. Negative for ear pain, sinus pressure, sore throat and trouble swallowing.   Eyes: Negative for discharge and redness.  Respiratory: Positive for cough. Negative for chest tightness and  shortness of breath.   Cardiovascular: Negative for chest pain.  Gastrointestinal: Negative for abdominal pain, diarrhea, nausea and vomiting.  Musculoskeletal: Negative for myalgias.  Skin: Negative for rash.  Neurological: Negative for dizziness, light-headedness and headaches.     Physical Exam Triage Vital Signs ED Triage Vitals  Enc Vitals Group     BP 04/22/20 1740 129/77     Pulse Rate 04/22/20 1740 68     Resp 04/22/20 1740 15     Temp 04/22/20 1740 98.1 F (36.7 C)     Temp Source 04/22/20 1740 Oral     SpO2 04/22/20 1740 100 %     Weight --      Height --      Head Circumference --      Peak Flow --      Pain Score 04/22/20 1732 0     Pain Loc --      Pain Edu? --      Excl. in [place]? --    No data found.  Updated Vital Signs BP 129/77 (BP Location: Left Arm)   Pulse 68   Temp 98.1 F (36.7 C) (Oral)   Resp 15   SpO2 100%   Visual Acuity Right Eye Distance:   Left Eye Distance:   Bilateral Distance:    Right Eye Near:   Left Eye Near:  Bilateral Near:     Physical Exam Vitals and nursing note reviewed.  Constitutional:      Appearance: He is well-developed.     Comments: No acute distress  HENT:     Head: Normocephalic and atraumatic.     Ears:     Comments: Bilateral ears without tenderness to palpation of external auricle, tragus and mastoid, EAC's without erythema or swelling, TM's with good bony landmarks and cone of light. Non erythematous.     Nose: Nose normal.     Mouth/Throat:     Comments: Oral mucosa pink and moist, no tonsillar enlargement or exudate. Posterior pharynx patent and nonerythematous, no uvula deviation or swelling. Normal phonation. Eyes:     Conjunctiva/sclera: Conjunctivae normal.  Cardiovascular:     Rate and Rhythm: Normal rate.  Pulmonary:     Effort: Pulmonary effort is normal. No respiratory distress.     Comments: Breathing comfortably at rest, CTABL, no wheezing, rales or other adventitious sounds  auscultated Abdominal:     General: There is no distension.  Musculoskeletal:        General: Normal range of motion.     Cervical back: Neck supple.  Skin:    General: Skin is warm and dry.  Neurological:     Mental Status: He is alert and oriented to person, place, and time.      UC Treatments / Results  Labs (all labs ordered are listed, but only abnormal results are displayed) Labs Reviewed  SARS CORONAVIRUS 2 (TAT 6-24 HRS) - Abnormal; Notable for the following components:      Result Value   SARS Coronavirus 2 POSITIVE (*)    All other components within normal limits    EKG   Radiology No results found.  Procedures Procedures (including critical care time)  Medications Ordered in UC Medications - No data to display  Initial Impression / Assessment and Plan / UC Course  I have reviewed the triage vital signs and the nursing notes.  Pertinent labs & imaging results that were available during my care of the patient were reviewed by me and considered in my medical decision making (see chart for details).     URI symptoms fevers and chills, less than 1 week. Covid test pending. Currently stable, recommending symptomatic and supportive care, rest and fluids.  Discussed strict return precautions. Patient verbalized understanding and is agreeable with plan.  Final Clinical Impressions(s) / UC Diagnoses   Final diagnoses:  Viral illness  Fever, unspecified     Discharge Instructions     COVID test pending Ibuprofen and Tylenol for fever body aches, headache Rest and fluids Flonase for congestion May continue DayQuil/NyQuil  Follow-up if not improving or worsening    ED Prescriptions    Medication Sig Dispense Auth. Provider   ibuprofen (ADVIL) 800 MG tablet Take 1 tablet (800 mg total) by mouth 3 (three) times daily. 21 tablet Carthel Castille C, PA-C   fluticasone (FLONASE) 50 MCG/ACT nasal spray Place 1-2 sprays into both nostrils daily for 7 days.  1 g Ady Heimann, [place] C, PA-C     PDMP not reviewed this encounter.   Kaniesha Barile, [place] C, PA-C 04/23/20 1010

## 2020-04-24 ENCOUNTER — Telehealth: Payer: Self-pay | Admitting: Adult Health

## 2020-04-24 NOTE — Telephone Encounter (Signed)
Called and LMOM regarding monoclonal antibody treatment for COVID 19 given to those who are at risk for complications and/or hospitalization of the virus.  Patient meets criteria based on: history of heart murmur  Call back number given: [phone]  My chart message: sent  Lillard Anes, NP

## 2020-05-01 ENCOUNTER — Other Ambulatory Visit: Payer: Self-pay

## 2020-05-01 ENCOUNTER — Emergency Department (HOSPITAL_COMMUNITY)
Admission: EM | Admit: 2020-05-01 | Discharge: 2020-05-02 | Disposition: A | Discharge status: Home / Self Care | Payer: HRSA Program | Attending: Emergency Medicine | Admitting: Emergency Medicine

## 2020-05-01 DIAGNOSIS — R45851 Suicidal ideations: Secondary | ICD-10-CM | POA: Insufficient documentation

## 2020-05-01 DIAGNOSIS — Z87891 Personal history of nicotine dependence: Secondary | ICD-10-CM | POA: Diagnosis not present

## 2020-05-01 DIAGNOSIS — U071 COVID-19: Secondary | ICD-10-CM | POA: Diagnosis present

## 2020-05-01 DIAGNOSIS — R4689 Other symptoms and signs involving appearance and behavior: Secondary | ICD-10-CM | POA: Diagnosis not present

## 2020-05-01 LAB — RAPID URINE DRUG SCREEN, HOSP PERFORMED
Amphetamines: NOT DETECTED
Barbiturates: NOT DETECTED
Benzodiazepines: NOT DETECTED
Cocaine: NOT DETECTED
Opiates: NOT DETECTED
Tetrahydrocannabinol: POSITIVE — AB

## 2020-05-01 LAB — COMPREHENSIVE METABOLIC PANEL
ALT: 21 U/L (ref 0–44)
AST: 24 U/L (ref 15–41)
Albumin: 4.9 g/dL (ref 3.5–5.0)
Alkaline Phosphatase: 76 U/L (ref 38–126)
Anion gap: 9 (ref 5–15)
BUN: 17 mg/dL (ref 6–20)
CO2: 26 mmol/L (ref 22–32)
Calcium: 9.5 mg/dL (ref 8.9–10.3)
Chloride: 106 mmol/L (ref 98–111)
Creatinine, Ser: 0.96 mg/dL (ref 0.61–1.24)
GFR calc Af Amer: >60 mL/min (ref >60)
GFR calc non Af Amer: >60 mL/min (ref >60)
Glucose, Bld: 88 mg/dL (ref 70–99)
Potassium: 4.2 mmol/L (ref 3.5–5.1)
Sodium: 141 mmol/L (ref 135–145)
Total Bilirubin: 1.1 mg/dL (ref 0.3–1.2)
Total Protein: 7.9 g/dL (ref 6.5–8.1)

## 2020-05-01 LAB — CBC
HCT: 45.1 % (ref 39.0–52.0)
Hemoglobin: 14.9 g/dL (ref 13.0–17.0)
MCH: 30.2 pg (ref 26.0–34.0)
MCHC: 33 g/dL (ref 30.0–36.0)
MCV: 91.5 fL (ref 80.0–100.0)
Platelets: 237 10*3/uL (ref 150–400)
RBC: 4.93 MIL/uL (ref 4.22–5.81)
RDW: 11.9 % (ref 11.5–15.5)
WBC: 4.6 10*3/uL (ref 4.0–10.5)
nRBC: 0 % (ref 0.0–0.2)

## 2020-05-01 LAB — ACETAMINOPHEN LEVEL: Acetaminophen (Tylenol), Serum: <10 ug/mL — ABNORMAL LOW (ref 10–30)

## 2020-05-01 LAB — SALICYLATE LEVEL: Salicylate Lvl: <7 mg/dL — ABNORMAL LOW (ref 7.0–30.0)

## 2020-05-01 LAB — ETHANOL: Alcohol, Ethyl (B): <10 mg/dL (ref <10)

## 2020-05-01 MED ORDER — ALUM & MAG HYDROXIDE-SIMETH 200-200-20 MG/5ML PO SUSP: 30.0000 mL | Freq: Four times a day (QID) | ORAL | Status: DC | PRN

## 2020-05-01 MED ORDER — ZOLPIDEM TARTRATE 5 MG PO TABS: 5.0000 mg | ORAL_TABLET | Freq: Every evening | ORAL | Status: DC | PRN

## 2020-05-01 MED ORDER — ACETAMINOPHEN 325 MG PO TABS: 650.0000 mg | ORAL_TABLET | ORAL | Status: DC | PRN

## 2020-05-01 NOTE — ED Notes (Signed)
Patient has been dressed out and wanded.  Patient has two personal belonging bags behind the nurses station

## 2020-05-01 NOTE — ED Provider Notes (Addendum)
[facility]-EMERGENCY DEPT Provider Note   CSN: [id number] Arrival date & time: 8/[name]/21  8295     History Chief Complaint  [name] presents with  . COVID Positive  . Suicidal    Daniel [name] is a 24 y.o. male.  HPI     24 year old male comes in W/ chief complaint of suicidal ideation.  [name] has no significant medical or psych history.  [name] reports that over the past few days he has been having suicidal ideation.  He has lost family and friends recently and that is the contributing factor.  He is not on any medications.  He has no active plan of hurting himself.  [name] was diagnosed with COVID-19 9 days ago.  He is not having any fevers anymore.  Pt denies nausea, emesis, fevers, chills, chest pains, shortness of breath, headaches, abdominal pain, uti like symptoms.   Past Medical History:  Diagnosis Date  . Heart murmur     [name] Active Problem List   Diagnosis Date Noted  . 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 HERNIA REPAIR         Family History  Problem Relation Age of Onset  . Healthy Mother   . Healthy Father     Social History   Tobacco Use  . Smoking status: Former Games developer  . Smokeless tobacco: Never Used  Substance Use Topics  . Alcohol use: No  . Drug use: Yes    Types: Marijuana    Home Medications Prior to Admission medications   Medication Sig Start Date End Date Taking? Authorizing Provider  fluticasone (FLONASE) 50 MCG/ACT nasal spray Place 1-2 sprays into both nostrils daily for 7 days. 04/22/20 04/29/20  Wieters, Hallie C, PA-C  ibuprofen (ADVIL) 800 MG tablet Take 1 tablet (800 mg total) by mouth 3 (three) times daily. [name] not taking: Reported on 8/[name]/Daniel 04/22/20   Patterson Hammersmith C, PA-C    Allergies    [name] has no known allergies.  Review of Systems   Review of Systems  Constitutional: Positive for activity change.  Respiratory: Negative  for shortness of breath.   Cardiovascular: Negative for chest pain.  Psychiatric/Behavioral: Positive for behavioral problems and suicidal ideas.  All other systems reviewed and are negative.   Physical Exam Updated Vital Signs BP 126/74 (BP Location: Left Arm)   Pulse 68   Temp 98.6 F (37 C) (Oral)   Resp 20   Ht 5\' 9"  (1.753 m)   Wt 63.5 kg   SpO2 99%   BMI 20.67 kg/m   Physical Exam Vitals and nursing note reviewed.  Constitutional:      Appearance: He is well-developed.  HENT:     Head: Atraumatic.  Cardiovascular:     Rate and Rhythm: Normal rate.  Pulmonary:     Effort: Pulmonary effort is normal.  Musculoskeletal:     Cervical back: Neck supple.  Skin:    General: Skin is warm.  Neurological:     Mental Status: He is alert and oriented to person, place, and time.  Psychiatric:        Behavior: Behavior normal.     Comments: Flat affect     ED Results / Procedures / Treatments   Labs (all labs ordered are listed, but only abnormal results are displayed) Labs Reviewed  SALICYLATE LEVEL - Abnormal; Notable for the following components:      Result Value   Salicylate Lvl <  7.0 (*)    All other components within normal limits  ACETAMINOPHEN LEVEL - Abnormal; Notable for the following components:   Acetaminophen (Tylenol), Serum <10 (*)    All other components within normal limits  RAPID URINE DRUG SCREEN, HOSP PERFORMED - Abnormal; Notable for the following components:   Tetrahydrocannabinol POSITIVE (*)    All other components within normal limits  COMPREHENSIVE METABOLIC PANEL  ETHANOL  CBC    EKG EKG Interpretation  Date/Time:  Sunday August [name] Daniel 16:52:51 EDT Ventricular Rate:  58 PR Interval:    QRS Duration: 81 QT Interval:  376 QTC Calculation: 370 R Axis:   97 Text Interpretation: Sinus rhythm Borderline right axis deviation ST elevation suggests acute pericarditis No acute changes No significant change since last tracing Confirmed by  Derwood Kaplan [phone]) on 8/[name]/Daniel 10:17:18 PM   Radiology No results found.  Procedures Procedures (including critical care time)  Medications Ordered in ED Medications  acetaminophen (TYLENOL) tablet 650 mg (has no administration in time range)  zolpidem (AMBIEN) tablet 5 mg (has no administration in time range)  alum & mag hydroxide-simeth (MAALOX/MYLANTA) 200-200-20 MG/5ML suspension 30 mL (has no administration in time range)    ED Course  I have reviewed the triage vital signs and the nursing notes.  Pertinent labs & imaging results that were available during my care of the [name] were reviewed by me and considered in my medical decision making (see chart for details).  Clinical Course as of Aug [name] 2317  Daniel [name], Daniel  2316 Psych has cleared the [name].   [AN]    Clinical Course User Index [AN] Derwood Kaplan, MD   MDM Rules/Calculators/A&P                          Daniel [name] was evaluated in Emergency Department on 8/[name]/Daniel for the symptoms described in the history of present illness. He was evaluated in the context of the global COVID-19 pandemic, which necessitated consideration that the [name] might be at risk for infection with the SARS-CoV-2 virus that causes COVID-19. Institutional protocols and algorithms that pertain to the evaluation of patients at risk for COVID-19 are in a state of rapid change based on information released by regulatory bodies including the CDC and federal and state organizations. These policies and algorithms were followed during the [name]'s care in the ED.   24 year old male who is actively having COVID-19, he will be off of quarantine on 8/17 based on CDC guidelines.  [name] is not having any COVID-19-like symptoms right now.  He is having SI.  This appears to be a primary psych visit. No acute medical complaints.  [name] is medically cleared for psych evaluation at this time.  Final Clinical Impression(s) / ED  Diagnoses Final diagnoses:  Suicidal ideation  COVID-19    Rx / DC Orders ED Discharge Orders    None       Derwood Kaplan, MD 08/[name]/21 1946    Derwood Kaplan, MD 08/[name]/21 2317

## 2020-05-01 NOTE — ED Triage Notes (Signed)
Pt arrives via EMS from home with complaints of SI without intent or plan. Pt has HX of SI in the past. Pt reports he tested positive of COVID 10 days ago. Pt reports poor PO intake since being diagnosed with COVID.

## 2020-05-01 NOTE — BH Assessment (Signed)
Comprehensive Clinical Assessment (CCA) Note  05/01/2020 Daniel [name] [id number]   Patient presenting voluntarily to [facility] due to COVID symptoms and SI with no plan. Patient denied SI, HI and psychosis to clinician. Patient able to contract for safety. Patient reported he was walking to uncles house earlier today when he got tired, as he has COVID, and sat down outside, when his girlfriend saw him and tried to talk to him, he was feeling sad and she misunderstood and thought he wanted to commit suicide. Patient reported to EDP that over the past few days he has been having suicidal ideations and that he has no active plan of hurting himself. Contributing factors include grief/loss over grandmothers death in past 2 weeks and the death of his cousin within the past week. Patient denied suicidal thoughts with clinician. Patient denied prior psych inpatient treatment, suicidal attempts and self-harming behaviors. Patient reported normal sleep and appetite.   Patient is not receiving any outpatient mental health services. Patient denied being prescribed any psych medications. Patient denied access to guns or weapons. Patient reported residing with mother and uncle, good relationship with both.   Collateral Contact [place], mother, [phone], stated "he is not suicidal he is not going to do anything to himself, his girlfriend was lying, I called them to tell them that I found out she was lying". Mother reported patient may have been down about death of family members. Mother reported no history of mental health issues other than ADHD. Mother denied prior suicide attempts or psych hospitalizations. Mother denied patient being suicidal. Mother reported that she wanted to pick patient up and that she would keep him safe.   Disposition: Lerry Liner, NP, psych cleared. Patient contracted for safety. [name], mother, agreed to make sure patient is safe. Patient will follow up with outpatient  therapy for grief counseling.   Visit Diagnosis:      ICD-10-CM   1. Suicidal ideation  R45.851   2. COVID-19  U07.1       CCA Screening, Triage and Referral (STR)  Patient Reported Information How did you hear about [place]? Family/Friend  Referral name: girlfriend  Referral phone number: No data recorded  Whom do you see for routine medical problems? Other (Comment)  Practice/Facility Name: No data recorded Practice/Facility Phone Number: No data recorded Name of Contact: No data recorded Contact Number: No data recorded Contact Fax Number: No data recorded Prescriber Name: No data recorded Prescriber Address (if known): No data recorded  What Is the Reason for Your Visit/Call Today? No data recorded How Long Has This Been Causing You Problems? <Week  What Do You Feel Would Help You the Most Today? Other (Comment) ("I am not suicidal")   Have You Recently Been in Any Inpatient Treatment (Hospital/Detox/Crisis Center/28-Day Program)? No  Name/Location of Program/Hospital:No data recorded How Long Were You There? No data recorded When Were You Discharged? No data recorded  Have You Ever Received Services From [facility] Before? No  Who Do You See at [facility]? No data recorded  Have You Recently Had Any Thoughts About Hurting Yourself? No  Are You Planning to Commit Suicide/Harm Yourself At This time? No   Have you Recently Had Thoughts About Hurting Someone Karolee Ohs? No  Explanation: No data recorded  Have You Used Any Alcohol or Drugs in the Past 24 Hours? No  How Long Ago Did You Use Drugs or Alcohol? No data recorded What Did You Use and How Much? No data recorded  Do You Currently Have a Therapist/Psychiatrist? No  Name of Therapist/Psychiatrist: No data recorded  Have You Been Recently Discharged From Any Office Practice or Programs? No  Explanation of Discharge From Practice/Program: No data recorded    CCA Screening Triage Referral Assessment Type  of Contact: Tele-Assessment  Is this Initial or Reassessment? Initial Assessment  Date Telepsych consult ordered in CHL:  No data recorded Time Telepsych consult ordered in CHL:  No data recorded  Patient Reported Information Reviewed? Yes  Patient Left Without Being Seen? No data recorded Reason for Not Completing Assessment: No data recorded  Collateral Involvement: Demarus Latterell, mother [phone]   Does Patient Have a Court Appointed Legal Guardian? No data recorded Name and Contact of Legal Guardian: No data recorded If Minor and Not Living with Parent(s), Who has Custody? No data recorded Is CPS involved or ever been involved? Never  Is APS involved or ever been involved? Never   Patient Determined To Be At Risk for Harm To Self or Others Based on Review of Patient Reported Information or Presenting Complaint? No  Method: No data recorded Availability of Means: No data recorded Intent: No data recorded Notification Required: No data recorded Additional Information for Danger to Others Potential: No data recorded Additional Comments for Danger to Others Potential: No data recorded Are There Guns or Other Weapons in Your Home? No data recorded Types of Guns/Weapons: No data recorded Are These Weapons Safely Secured?                            No data recorded Who Could Verify You Are Able To Have These Secured: No data recorded Do You Have any Outstanding Charges, Pending Court Dates, Parole/Probation? No data recorded Contacted To Inform of Risk of Harm To Self or Others: No data recorded  Location of Assessment: [facility]   Does Patient Present under Involuntary Commitment? No  IVC Papers Initial File Date: No data recorded  [place] of Residence: [place]   Patient Currently Receiving the Following Services: Not Receiving Services   Determination of Need: Routine (7 days)   Options For Referral: Other: Comment     CCA  Biopsychosocial  Intake/Chief Complaint:  CCA Intake With Chief Complaint Chief Complaint/Presenting Problem: COVID symptoms and SI Patient's Currently Reported Symptoms/Problems: COVID symptoms and no suicidal ideations Type of Services Patient Feels Are Needed: "none"  Mental Health Symptoms Depression:  Depression: None  Mania:  Mania: None  Anxiety:   Anxiety: None  Psychosis:  Psychosis: None  Trauma:  Trauma: None  Obsessions:  Obsessions: None  Compulsions:  Compulsions: None  Inattention:  Inattention: None  Hyperactivity/Impulsivity:  Hyperactivity/Impulsivity: N/A  Oppositional/Defiant Behaviors:  Oppositional/Defiant Behaviors: None  Emotional Irregularity:  Emotional Irregularity: None  Other Mood/Personality Symptoms:      Mental Status Exam Appearance and self-care  Stature:  Stature: Average  Weight:  Weight: Average weight  Clothing:  Clothing: Age-appropriate  Grooming:  Grooming: Normal  Cosmetic use:  Cosmetic Use: Age appropriate  Posture/gait:  Posture/Gait: Normal  Motor activity:  Motor Activity: Not Remarkable  Sensorium  Attention:  Attention: Normal  Concentration:  Concentration: Normal  Orientation:  Orientation: Time, Situation, Place, Person  Recall/memory:  Recall/Memory: Normal  Affect and Mood  Affect:  Affect: Appropriate  Mood:     Relating  Eye contact:     Facial expression:  Facial Expression: Sad  Attitude toward examiner:  Attitude Toward Examiner: Cooperative  Thought and Language  Speech flow: Speech Flow: Normal  Thought content:  Thought Content: Appropriate to Mood and Circumstances  Preoccupation:  Preoccupations: None  Hallucinations:  Hallucinations: None  Organization:     Company secretary of Knowledge:  Fund of Knowledge: Average  Intelligence:  Intelligence: Average  Abstraction:     Judgement:     Dance movement psychotherapist:     Insight:  Insight: Fair  Decision Making:     Social Functioning  Social Maturity:      Social Judgement:     Stress  Stressors:  Stressors: Grief/losses, Other (Comment), Relationship  Coping Ability:     Skill Deficits:     Supports:  Supports: Family     Religion:    Leisure/Recreation:    Exercise/Diet: Exercise/Diet Do You Follow a Special Diet?: No Do You Have Any Trouble Sleeping?: No   CCA Employment/Education  Employment/Work Situation: Employment / Work Situation Employment situation: Unemployed Has patient ever been in the [employer]?: No  Education: Education Is Patient Currently Attending School?: No Did Garment/textile technologist From [employer]?: Yes Did Theme park manager?: No Did You Have Any Difficulty At [employer]?: No   CCA Family/Childhood History  Family and Relationship History: Family history Does patient have children?: Yes How many children?: 1 How is patient's relationship with their children?: 31 year old daughter, good relationship  Childhood History:  Childhood History Does patient have siblings?:  (uta) Did patient suffer any verbal/emotional/physical/sexual abuse as a child?:  (uta) Did patient suffer from severe childhood neglect?:  (uta) Has patient ever been sexually abused/assaulted/raped as an adolescent or adult?:  (uta) Was the patient ever a victim of a crime or a disaster?:  (uta) Witnessed domestic violence?:  (uta) Has patient been affected by domestic violence as an adult?:  Industrial/product designer)  Child/Adolescent Assessment:     CCA Substance Use  Alcohol/Drug Use: Alcohol / Drug Use Pain Medications: see MAR Prescriptions: see MAR Over the Counter: see MAR History of alcohol / drug use?: No history of alcohol / drug abuse                         ASAM's:  Six Dimensions of Multidimensional Assessment  Dimension 1:  Acute Intoxication and/or Withdrawal Potential:      Dimension 2:  Biomedical Conditions and Complications:      Dimension 3:  Emotional, Behavioral, or Cognitive Conditions and Complications:      Dimension 4:  Readiness to Change:     Dimension 5:  Relapse, Continued use, or Continued Problem Potential:     Dimension 6:  Recovery/Living Environment:     ASAM Severity Score:    ASAM Recommended Level of Treatment:     Substance use Disorder (SUD)    Recommendations for Services/Supports/Treatments:    DSM5 Diagnoses: Patient Active Problem List   Diagnosis Date Noted  . Hypospadias in male 03/03/2015    Patient Centered Plan: Patient is on the following Treatment Plan(s):    Referrals to Alternative Service(s): Referred to Alternative Service(s):   Place:   Date:   Time:    Referred to Alternative Service(s):   Place:   Date:   Time:    Referred to Alternative Service(s):   Place:   Date:   Time:    Referred to Alternative Service(s):   Place:   Date:   Time:     Dash Cardarelli D AlstonComprehensive Clinical Assessment (CCA) Screening, Triage and Referral Note  05/01/2020 Michele Mcalpine [id number]  Visit Diagnosis:    ICD-10-CM   1. Suicidal ideation  R45.851   2. COVID-19  U07.1     Patient Reported Information How did you hear about [place]? Family/Friend   Referral name: girlfriend   Referral phone number: No data recorded Whom do you see for routine medical problems? Other (Comment)   Practice/Facility Name: No data recorded  Practice/Facility Phone Number: No data recorded  Name of Contact: No data recorded  Contact Number: No data recorded  Contact Fax Number: No data recorded  Prescriber Name: No data recorded  Prescriber Address (if known): No data recorded What Is the Reason for Your Visit/Call Today? No data recorded How Long Has This Been Causing You Problems? <Week  Have You Recently Been in Any Inpatient Treatment (Hospital/Detox/Crisis Center/28-Day Program)? No   Name/Location of Program/Hospital:No data recorded  How Long Were You There? No data recorded  When Were You Discharged? No data recorded Have You Ever Received Services From [facility] Before? No   Who Do You See at [facility]? No data recorded Have You Recently Had Any Thoughts About Hurting Yourself? No   Are You Planning to Commit Suicide/Harm Yourself At This time?  No  Have you Recently Had Thoughts About Hurting Someone Karolee Ohs? No   Explanation: No data recorded Have You Used Any Alcohol or Drugs in the Past 24 Hours? No   How Long Ago Did You Use Drugs or Alcohol?  No data recorded  What Did You Use and How Much? No data recorded What Do You Feel Would Help You the Most Today? Other (Comment) ("I am not suicidal")  Do You Currently Have a Therapist/Psychiatrist? No   Name of Therapist/Psychiatrist: No data recorded  Have You Been Recently Discharged From Any Office Practice or Programs? No   Explanation of Discharge From Practice/Program:  No data recorded    CCA Screening Triage Referral Assessment Type of Contact: Tele-Assessment   Is this Initial or Reassessment? Initial Assessment   Date Telepsych consult ordered in CHL:  No data recorded  Time Telepsych consult ordered in CHL:  No data recorded Patient Reported Information Reviewed? Yes   Patient Left Without Being Seen? No data recorded  Reason for Not Completing Assessment: No data recorded Collateral Involvement: Donaven Criswell, mother [phone]  Does Patient Have a Court Appointed Legal Guardian? No data recorded  Name and Contact of Legal Guardian:  No data recorded If Minor and Not Living with Parent(s), Who has Custody? No data recorded Is CPS involved or ever been involved? Never  Is APS involved or ever been involved? Never  Patient Determined To Be At Risk for Harm To Self or Others Based on Review of Patient Reported Information or Presenting Complaint? No   Method: No data recorded  Availability of Means: No data recorded  Intent: No data recorded  Notification Required: No data recorded  Additional Information for Danger to Others Potential:  No data  recorded  Additional Comments for Danger to Others Potential:  No data recorded  Are There Guns or Other Weapons in Your Home?  No data recorded   Types of Guns/Weapons: No data recorded   Are These Weapons Safely Secured?                              No data recorded   Who Could Verify You Are Able To Have These Secured:  No data recorded Do You Have any Outstanding Charges, Pending Court Dates, Parole/Probation? No data recorded Contacted To Inform of Risk of Harm To Self or Others: No data recorded [facility]  Does Patient Present under Involuntary Commitment? No   IVC Papers Initial File Date: No data recorded  [place]  Patient Currently Receiving the Following Services: Not Receiving Services   Determination of Need: Routine (7 days)   Options For Referral: Other: Comment   Burnetta SabinLatisha D Kataleia Quaranta, [facility]

## 2020-05-01 NOTE — ED Notes (Signed)
Daniel Liner, NP, psych cleared. Patient contracted for safety. [name], mother, agreed to make sure patient is safe. Patient will follow up with outpatient therapy for grief counseling.

## 2020-05-01 NOTE — Discharge Instructions (Signed)
Follow-up with the outpatient resources for optimal management.  RETURN TO THE ER IF THE SYMPTOMS GET WORSE.

## 2020-05-14 ENCOUNTER — Encounter (HOSPITAL_COMMUNITY): Payer: No Payment, Other | Admitting: Adult Health

## 2020-06-17 ENCOUNTER — Other Ambulatory Visit: Payer: Self-pay

## 2020-06-17 ENCOUNTER — Encounter (HOSPITAL_COMMUNITY): Payer: Self-pay | Admitting: Psychiatry

## 2020-06-17 ENCOUNTER — Telehealth (HOSPITAL_COMMUNITY): Payer: Self-pay | Admitting: Psychiatry

## 2020-06-17 ENCOUNTER — Telehealth (HOSPITAL_COMMUNITY): Payer: No Payment, Other | Admitting: Psychiatry

## 2020-06-20 NOTE — Telephone Encounter (Signed)
Provider called patient to see if he could keep his appointment today. He notes that he would log on virtually however he did not. Provider called patinet twice without response.

## 2020-08-28 ENCOUNTER — Encounter (HOSPITAL_COMMUNITY): Payer: Self-pay

## 2020-08-28 ENCOUNTER — Emergency Department (HOSPITAL_COMMUNITY)
Admission: EM | Admit: 2020-08-28 | Discharge: 2020-08-28 | Disposition: A | Discharge status: Home / Self Care | Payer: Medicaid Other | Attending: Emergency Medicine | Admitting: Emergency Medicine

## 2020-08-28 ENCOUNTER — Other Ambulatory Visit: Payer: Self-pay

## 2020-08-28 ENCOUNTER — Emergency Department (HOSPITAL_COMMUNITY): Payer: Medicaid Other

## 2020-08-28 DIAGNOSIS — Z87891 Personal history of nicotine dependence: Secondary | ICD-10-CM | POA: Insufficient documentation

## 2020-08-28 DIAGNOSIS — S42295A Other nondisplaced fracture of upper end of left humerus, initial encounter for closed fracture: Secondary | ICD-10-CM | POA: Insufficient documentation

## 2020-08-28 DIAGNOSIS — M21822 Other specified acquired deformities of left upper arm: Secondary | ICD-10-CM

## 2020-08-28 DIAGNOSIS — T1490XA Injury, unspecified, initial encounter: Secondary | ICD-10-CM

## 2020-08-28 DIAGNOSIS — W19XXXA Unspecified fall, initial encounter: Secondary | ICD-10-CM | POA: Insufficient documentation

## 2020-08-28 DIAGNOSIS — S43005A Unspecified dislocation of left shoulder joint, initial encounter: Secondary | ICD-10-CM | POA: Insufficient documentation

## 2020-08-28 DIAGNOSIS — Z79899 Other long term (current) drug therapy: Secondary | ICD-10-CM | POA: Insufficient documentation

## 2020-08-28 MED ORDER — MORPHINE SULFATE (PF) 4 MG/ML IV SOLN: 4.0000 mg | Freq: Once | INTRAVENOUS | Status: DC

## 2020-08-28 MED ORDER — OXYCODONE-ACETAMINOPHEN 5-325 MG PO TABS: 2.0000 | ORAL_TABLET | Freq: Once | ORAL | Status: DC

## 2020-08-28 MED ORDER — PROPOFOL 10 MG/ML IV BOLUS: 0.5000 mg/kg | Freq: Once | INTRAVENOUS | Status: DC

## 2020-08-28 NOTE — ED Notes (Signed)
Shoulder immobilizer placed on L arm.

## 2020-08-28 NOTE — ED Triage Notes (Signed)
Patient arrived stating about 30 minutes prior to arrival he fell and hurt his left shoulder, states he has dislocated it in the past and it feels the same. Unable to move extremity.

## 2020-08-28 NOTE — ED Provider Notes (Signed)
[facility]-EMERGENCY DEPT Provider Note   CSN: [id number] Arrival date & time: 08/28/20  1955     History Chief Complaint  Patient presents with   Shoulder Injury    Daniel [name] is a 24 y.o. male.  HPI   24 year old male presents to the emergency department complaining of shoulder injury and pain. Patient states that he fell approximately 30 minutes before coming into the ED. He states that this is similar to prior injury where he had dislocation. He denies other injury.  Past Medical History:  Diagnosis Date   Heart murmur     Patient Active Problem List   Diagnosis Date Noted   Hypospadias in male 03/03/2015    Past Surgical History:  Procedure Laterality Date   HERNIA REPAIR         Family History  Problem Relation Age of Onset   Healthy Mother    Healthy Father     Social History   Tobacco Use   Smoking status: Former Smoker   Smokeless tobacco: Never Used  Substance Use Topics   Alcohol use: No   Drug use: Yes    Types: Marijuana    Home Medications Prior to Admission medications   Medication Sig Start Date End Date Taking? Authorizing Provider  fluticasone (FLONASE) 50 MCG/ACT nasal spray Place 1-2 sprays into both nostrils daily for 7 days. 04/22/20 04/29/20  Wieters, Hallie C, PA-C  ibuprofen (ADVIL) 800 MG tablet Take 1 tablet (800 mg total) by mouth 3 (three) times daily. Patient not taking: Reported on 05/01/2020 04/22/20   Patterson Hammersmith C, PA-C    Allergies    Patient has no known allergies.  Review of Systems   Review of Systems  All other systems reviewed and are negative.   Physical Exam Updated Vital Signs BP (!) 149/109 (BP Location: Right Arm)    Pulse 78    Temp 98.1 F (36.7 C) (Oral)    Resp 20    SpO2 98%   Physical Exam Vitals and nursing note reviewed.  HENT:     Head: Normocephalic.     Right Ear: External ear normal.     Nose: Nose normal.     Mouth/Throat:     Mouth: Mucous  membranes are moist.  Eyes:     Pupils: Pupils are equal, round, and reactive to light.  Cardiovascular:     Rate and Rhythm: Normal rate.     Pulses: Normal pulses.  Pulmonary:     Effort: Pulmonary effort is normal.  Abdominal:     General: Abdomen is flat.  Musculoskeletal:        General: Tenderness and signs of injury present.     Cervical back: Normal range of motion.     Comments: Left shoulder with pain to palpation, deformity  Skin:    General: Skin is warm.     Capillary Refill: Capillary refill takes less than 2 seconds.  Neurological:     General: No focal deficit present.     Mental Status: He is alert.  Psychiatric:        Mood and Affect: Mood normal.     ED Results / Procedures / Treatments   Labs (all labs ordered are listed, but only abnormal results are displayed) Labs Reviewed - No data to display  EKG None  Radiology DG Shoulder Left  Result Date: 08/28/2020 CLINICAL DATA:  Injury EXAM: LEFT SHOULDER - 2+ VIEW COMPARISON:  08/28/2020 FINDINGS: Interval reduction of the  previously seen dislocated left shoulder. Normal alignment. No visible fracture. IMPRESSION: Interval reduction of the previously seen dislocated left shoulder. No acute findings. Electronically Signed   By: Charlett Nose M.D.   On: 08/28/2020 21:29   DG Shoulder Left  Result Date: 08/28/2020 CLINICAL DATA:  Fall, shoulder pain, history of prior dislocation EXAM: LEFT SHOULDER - 2+ VIEW COMPARISON:  Radiographs 01/10/2016 FINDINGS: Anterior shoulder dislocation with an associated posterolateral humeral head impaction fracture, perched upon the glenoid. No discernible bony Bankart is visible at this time. Associated mild soft tissue swelling. Remaining bones of the shoulders are intact and aligned. IMPRESSION: Perched anterior shoulder dislocation with an associated posterolateral humeral head impaction fracture (Hill-Sachs deformity). Electronically Signed   By: Kreg Shropshire M.D.   On:  08/28/2020 20:19    Procedures .Ortho Injury Treatment  Date/Time: 08/28/2020 9:18 PM Performed by: Margarita Grizzle, MD Authorized by: Margarita Grizzle, MD   Consent:    Consent obtained:  Verbal   Consent given by:  Patient   Risks discussed:  Nerve damage and fractureInjury location: shoulder Location details: left shoulder Injury type: dislocation Dislocation type: anterior Hill-Sachs deformity: yes Chronicity: recurrent Pre-procedure neurovascular assessment: neurovascularly intact Pre-procedure distal perfusion: normal Pre-procedure neurological function: normal Pre-procedure range of motion: reduced  Anesthesia: Local anesthesia used: no Manipulation performed: yes Reduction method: scapular manipulation Immobilization: sling Post-procedure neurovascular assessment: post-procedure neurovascularly intact Post-procedure distal perfusion: normal Post-procedure neurological function: normal Post-procedure range of motion: improved    (including critical care time)  Medications Ordered in ED Medications  propofol (DIPRIVAN) 10 mg/mL bolus/IV push 0.5 mg/kg (has no administration in time range)  morphine 4 MG/ML injection 4 mg (has no administration in time range)    ED Course  I have reviewed the triage vital signs and the nursing notes.  Pertinent labs & imaging results that were available during my care of the patient were reviewed by me and considered in my medical decision making (see chart for details).    MDM Rules/Calculators/A&P                          Discussed shoulder dislocation treatment need for immobilizer.  Patient referred to orthopedic surgery. Final Clinical Impression(s) / ED Diagnoses Final diagnoses:  Injury  Dislocation of left shoulder joint, initial encounter  Hill Sachs deformity, left    Rx / DC Orders ED Discharge Orders    None       Margarita Grizzle, MD 08/28/20 2135

## 2020-11-29 ENCOUNTER — Ambulatory Visit: Payer: Medicaid Other | Admitting: Nurse Practitioner

## 2020-12-02 ENCOUNTER — Ambulatory Visit: Payer: Self-pay | Attending: Nurse Practitioner | Admitting: Nurse Practitioner

## 2020-12-02 ENCOUNTER — Other Ambulatory Visit: Payer: Self-pay

## 2020-12-02 ENCOUNTER — Encounter: Payer: Self-pay | Admitting: Nurse Practitioner

## 2020-12-02 DIAGNOSIS — S43005D Unspecified dislocation of left shoulder joint, subsequent encounter: Secondary | ICD-10-CM

## 2020-12-02 NOTE — Progress Notes (Signed)
Virtual Visit via Telephone Note Due to national recommendations of social distancing due to COVID 19, telehealth visit is felt to be most appropriate for this patient at this time.  I discussed the limitations, risks, security and privacy concerns of performing an evaluation and management service by telephone and the availability of in person appointments. I also discussed with the patient that there may be a patient responsible charge related to this service. The patient expressed understanding and agreed to proceed.    I connected with Daniel [name] on 12/02/20  at   1:50 PM EDT  EDT by telephone and verified that I am speaking with the correct person using two identifiers.   Consent I discussed the limitations, risks, security and privacy concerns of performing an evaluation and management service by telephone and the availability of in person appointments. I also discussed with the patient that there may be a patient responsible charge related to this service. The patient expressed understanding and agreed to proceed.   Location of Patient: Private Residence   Location of Provider: [facility]    Persons participating in Telemedicine visit: Bertram Denver FNP-BC YY [place] CMA Jamse Belfast [employer]    History of Present Illness: Telemedicine visit for: Establish care  Several years ago he was play wrestling with a friend and his shoulder "popped out but it went back in". States since then his left shoulder pops out with strenuous activity or certain positions and then he has to manipulate the shoulder for correction. He was seen for this last year in the ED 08-2020 and instructed to follow up with Dr. Charlann Boxer the orthopedic surgeon however he was lost to follow-up.  Today he is requesting a referral to the orthopedist however he is unsure if he is covered for his insurance under his mother's policy or if he will need to apply for the financial assistance through  the [facility].  He will speak with his mother guarding his insurance status and based on that will let us know and we will proceed with referral as instructed.  Denies chest pain, shortness of breath, palpitations, lightheadedness, dizziness, headaches or visual disturbances   Past Medical History:  Diagnosis Date  . Heart murmur     Past Surgical History:  Procedure Laterality Date  . HERNIA REPAIR      Family History  Problem Relation Age of Onset  . Healthy Mother   . Healthy Father     Social History   Socioeconomic History  . Marital status: Single    Spouse name: Not on file  . Number of children: Not on file  . Years of education: Not on file  . Highest education level: Not on file  Occupational History  . Not on file  Tobacco Use  . Smoking status: Former Games developer  . Smokeless tobacco: Never Used  Vaping Use  . Vaping Use: Never used  Substance and Sexual Activity  . Alcohol use: No  . Drug use: Yes    Types: Marijuana  . Sexual activity: Not on file  Other Topics Concern  . Not on file  Social History Narrative  . Not on file   Social Determinants of Health   Financial Resource Strain: Not on file  Food Insecurity: Not on file  Transportation Needs: Not on file  Physical Activity: Not on file  Stress: Not on file  Social Connections: Not on file     Observations/Objective: Awake, alert and oriented  x 3   Review of Systems  Constitutional: Negative for fever, malaise/fatigue and weight loss.  HENT: Negative.  Negative for nosebleeds.   Eyes: Negative.  Negative for blurred vision, double vision and photophobia.  Respiratory: Negative.  Negative for cough and shortness of breath.   Cardiovascular: Negative.  Negative for chest pain, palpitations and leg swelling.  Gastrointestinal: Negative.  Negative for heartburn, nausea and vomiting.  Musculoskeletal: Positive for joint pain. Negative for myalgias.  Neurological:  Negative.  Negative for dizziness, focal weakness, seizures and headaches.  Psychiatric/Behavioral: Negative.  Negative for suicidal ideas.    Assessment and Plan: Daniel [name] was seen today for new patient (initial visit).  Diagnoses and all orders for this visit:  Dislocation of left shoulder joint, subsequent encounter Orthopedic Surgery Contact: [address] [place] [address] [phone]    Follow Up Instructions Return if symptoms worsen or fail to improve.     I discussed the assessment and treatment plan with the patient. The patient was provided an opportunity to ask questions and all were answered. The patient agreed with the plan and demonstrated an understanding of the instructions.   The patient was advised to call back or seek an in-person evaluation if the symptoms worsen or if the condition fails to improve as anticipated.  I provided 12 minutes of non-face-to-face time during this encounter including median intraservice time, reviewing previous notes, labs, imaging, medications and explaining diagnosis and management.  Claiborne Rigg, FNP-BC

## 2021-04-09 IMAGING — CR DG SHOULDER 2+V*L*
2 series · 2 of 2 positions shown · non-contrast
Comparison: Radiographs 01/10/2016

CLINICAL DATA: Fall, shoulder pain, history of prior dislocation

EXAM:
LEFT SHOULDER - 2+ VIEW

[w shoulder external left]
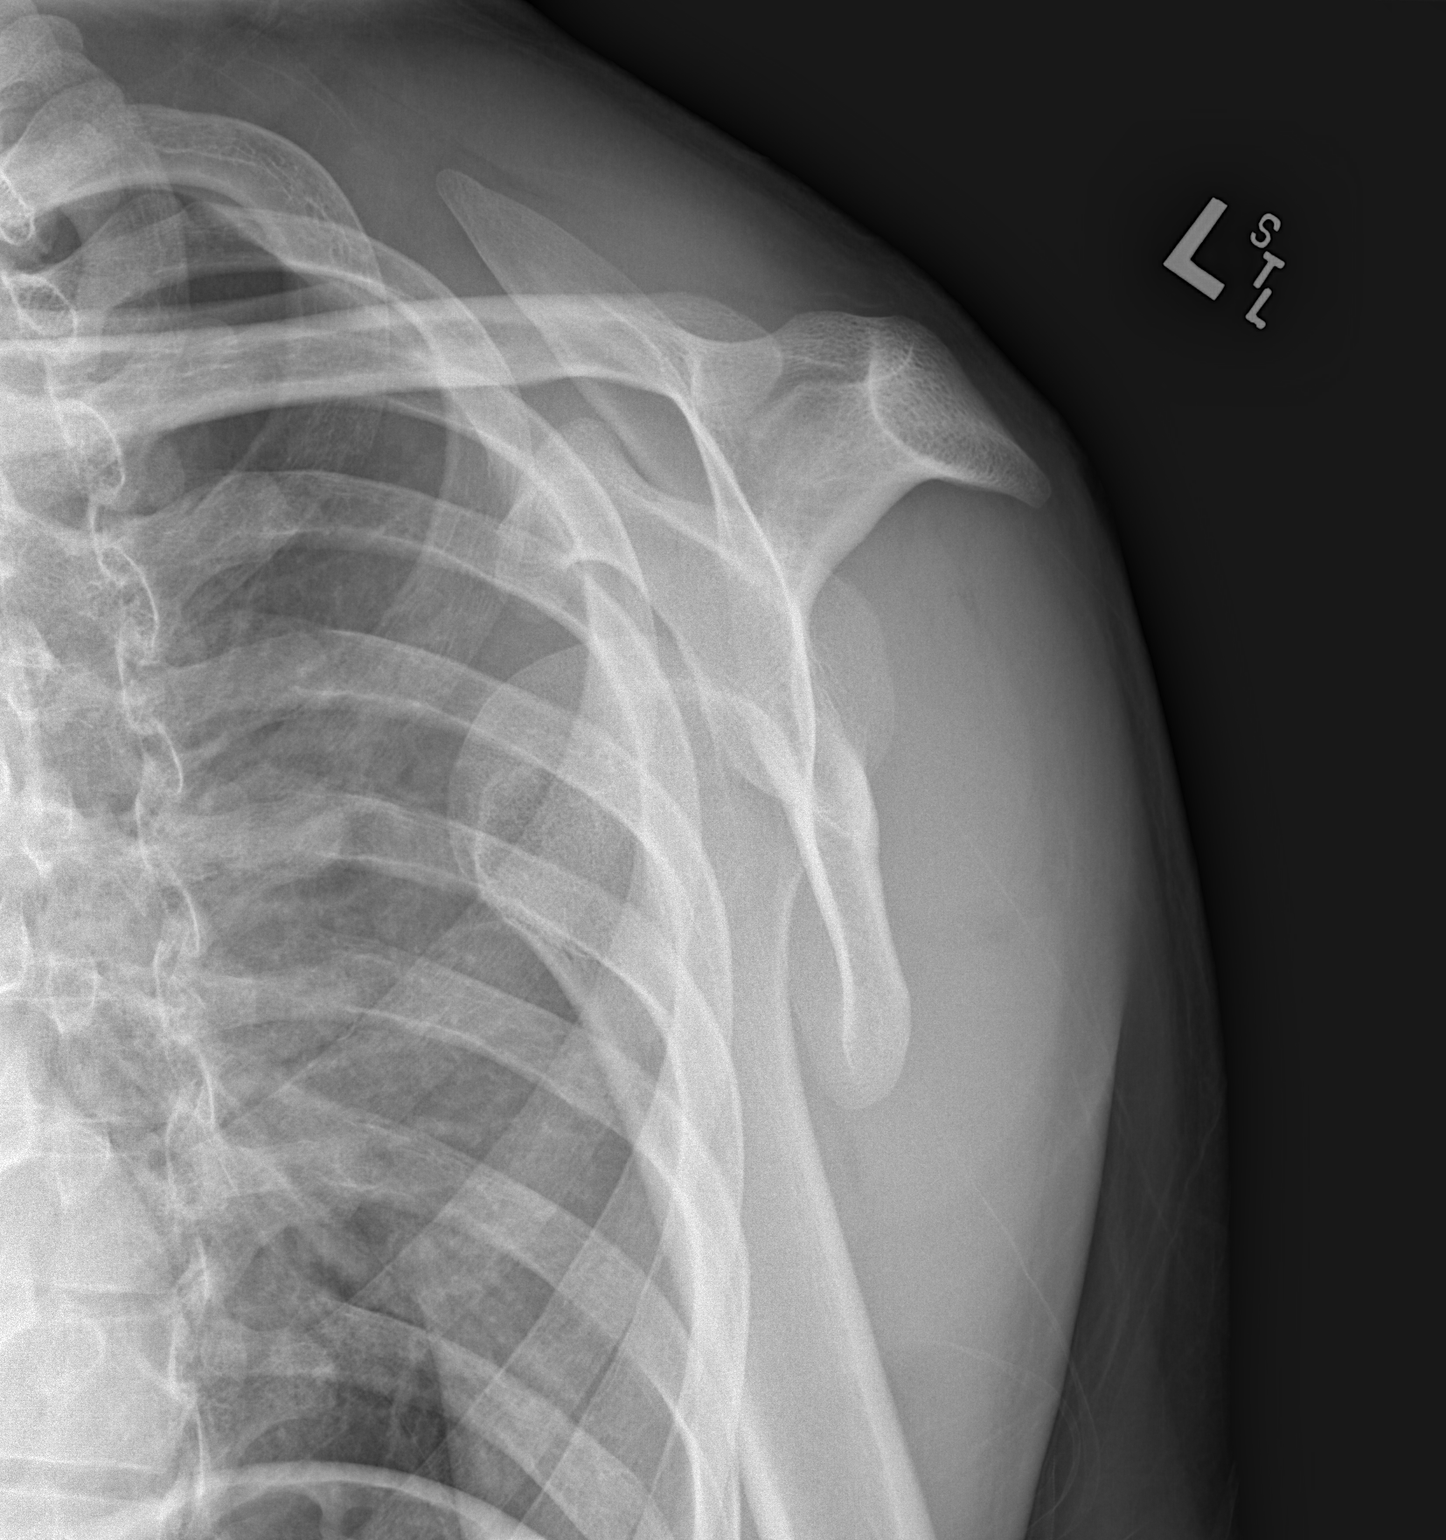

[w shoulder internal left]
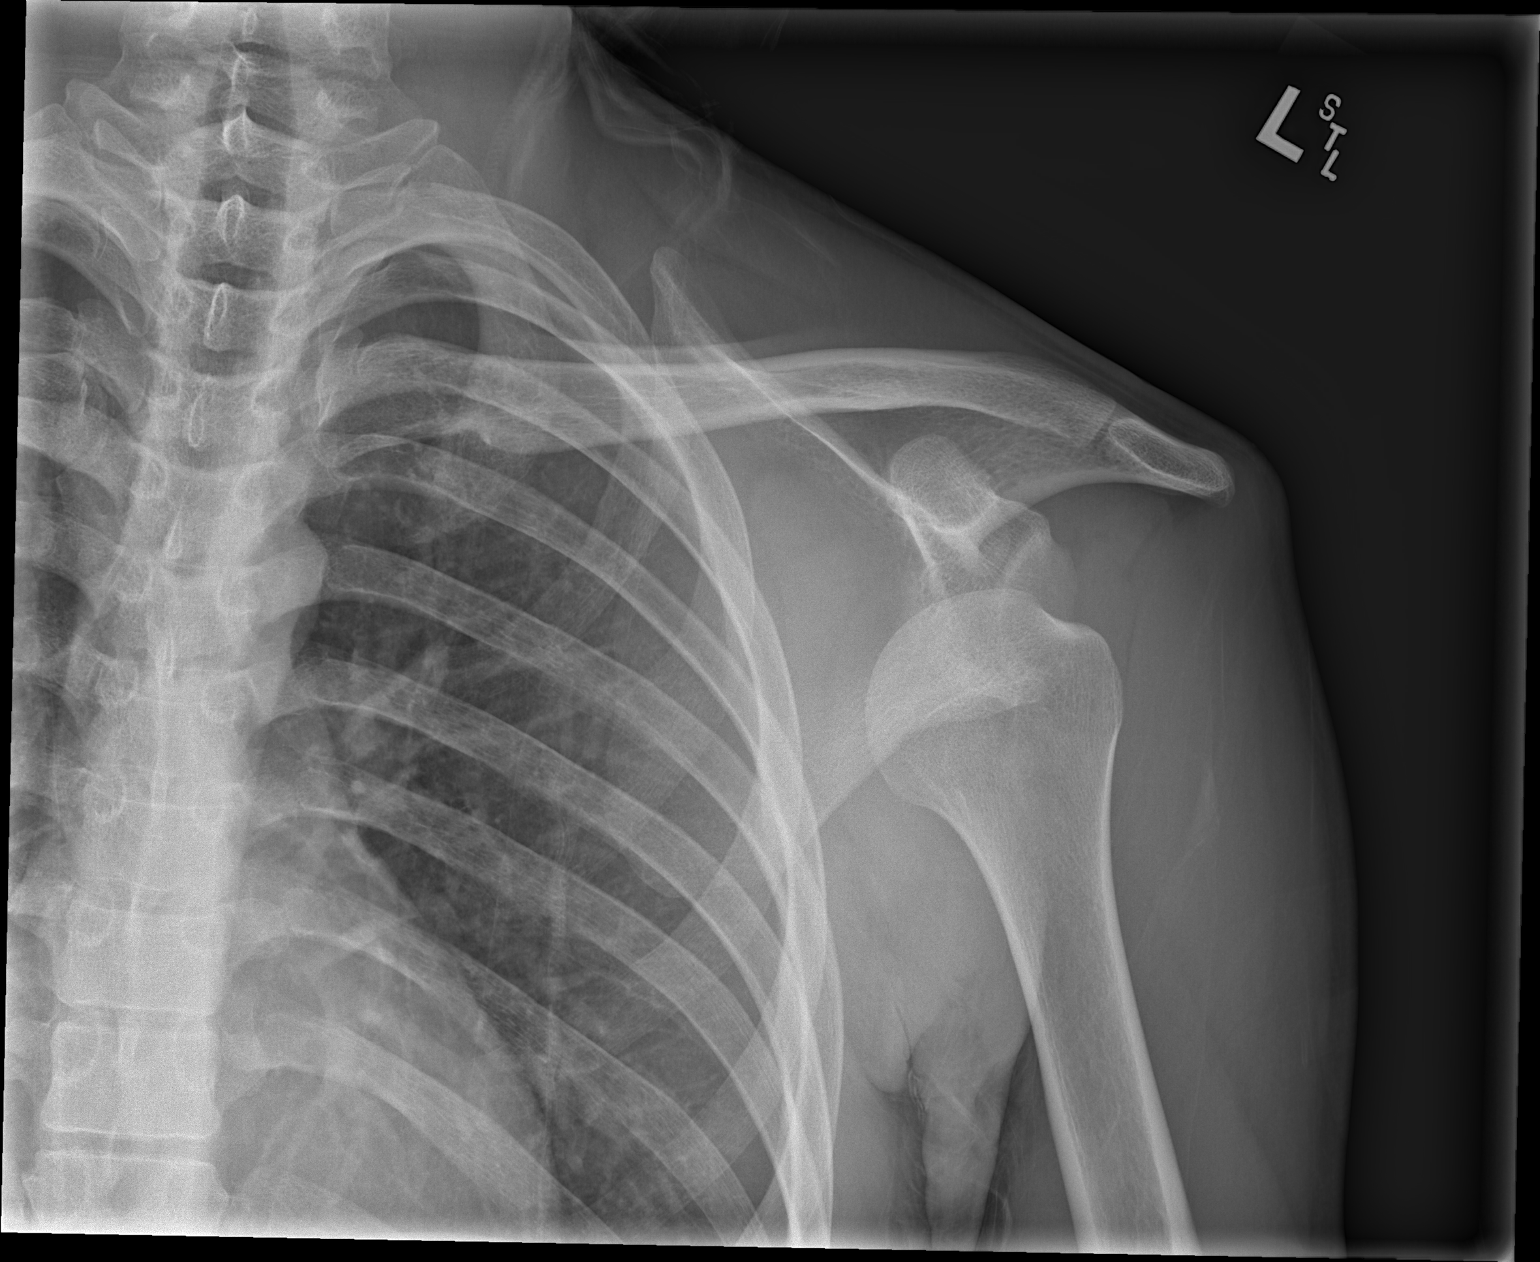

[2 of 2 positions shown; findings below may reference images not displayed]

FINDINGS: Anterior shoulder dislocation with an associated posterolateral
humeral head impaction fracture, perched upon the glenoid. No
discernible bony Bankart is visible at this time. Associated mild
soft tissue swelling. Remaining bones of the shoulders are intact
and aligned.
IMPRESSION: Perched anterior shoulder dislocation with an associated
posterolateral humeral head impaction fracture (Hill-Sachs
deformity).

## 2021-05-13 ENCOUNTER — Other Ambulatory Visit: Payer: Self-pay

## 2021-05-13 ENCOUNTER — Encounter: Payer: Self-pay | Admitting: Emergency Medicine

## 2021-05-13 ENCOUNTER — Ambulatory Visit
Admission: EM | Admit: 2021-05-13 | Discharge: 2021-05-13 | Disposition: A | Discharge status: Home / Self Care | Payer: Medicaid Other | Attending: Internal Medicine | Admitting: Internal Medicine

## 2021-05-13 ENCOUNTER — Encounter (HOSPITAL_COMMUNITY): Payer: Self-pay | Admitting: Emergency Medicine

## 2021-05-13 ENCOUNTER — Emergency Department (HOSPITAL_COMMUNITY)
Admission: EM | Admit: 2021-05-13 | Discharge: 2021-05-13 | Discharge status: Against Medical Advice | Payer: Medicaid Other | Attending: Emergency Medicine | Admitting: Emergency Medicine

## 2021-05-13 DIAGNOSIS — Z87891 Personal history of nicotine dependence: Secondary | ICD-10-CM | POA: Insufficient documentation

## 2021-05-13 DIAGNOSIS — R369 Urethral discharge, unspecified: Secondary | ICD-10-CM

## 2021-05-13 DIAGNOSIS — Z202 Contact with and (suspected) exposure to infections with a predominantly sexual mode of transmission: Secondary | ICD-10-CM | POA: Insufficient documentation

## 2021-05-13 DIAGNOSIS — Z113 Encounter for screening for infections with a predominantly sexual mode of transmission: Secondary | ICD-10-CM | POA: Insufficient documentation

## 2021-05-13 MED ORDER — DOXYCYCLINE HYCLATE 100 MG PO TABS
100.0000 mg | ORAL_TABLET | Freq: Once | ORAL | Status: AC
  Administered 2021-05-13: 100 mg via ORAL
  Filled 2021-05-13: qty 1

## 2021-05-13 MED ORDER — DOXYCYCLINE HYCLATE 100 MG PO CAPS
100.0000 mg | ORAL_CAPSULE | Freq: Two times a day (BID) | ORAL | 0 refills | Status: DC
Start: 2021-05-13 — End: 2021-05-13

## 2021-05-13 MED ORDER — STERILE WATER FOR INJECTION IJ SOLN
INTRAMUSCULAR | Status: AC
  Filled 2021-05-13: qty 10

## 2021-05-13 MED ORDER — DOXYCYCLINE HYCLATE 100 MG PO CAPS
100.0000 mg | ORAL_CAPSULE | Freq: Two times a day (BID) | ORAL | 0 refills | Status: DC
Start: 2021-05-13 — End: 2021-08-21

## 2021-05-13 MED ORDER — CEFTRIAXONE SODIUM 500 MG IJ SOLR
500.0000 mg | Freq: Once | INTRAMUSCULAR | Status: AC
  Administered 2021-05-13: 500 mg via INTRAMUSCULAR
  Filled 2021-05-13: qty 500

## 2021-05-13 NOTE — ED Triage Notes (Signed)
Patient here with complain of chlamydia exposure, and wants treatment, endorses penile discharge.

## 2021-05-13 NOTE — ED Provider Notes (Signed)
[facility]    CSN: [id number] Arrival date & time: 05/13/21  1146      History   Chief Complaint Chief Complaint  Patient presents with   Exposure to STD    HPI Daniel [name] is a 25 y.o. male.   Patient presents with chlamydia exposure that occurred about 3 weeks ago. Patient started having white penile discharge yesterday. Denies any known fevers, abdominal pain, pelvic pain, back pain, urinary burning, urinary frequency.    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 Authorizing Provider  doxycycline (VIBRAMYCIN) 100 MG capsule Take 1 capsule (100 mg total) by mouth 2 (two) times daily. 05/13/21  Yes Lance Muss, FNP  fluticasone (FLONASE) 50 MCG/ACT nasal spray Place 1-2 sprays into both nostrils daily for 7 days. 04/22/20 04/29/20  Wieters, Hallie C, PA-C  ibuprofen (ADVIL) 800 MG tablet Take 1 tablet (800 mg total) by mouth 3 (three) times daily. Patient not taking: No sig reported 04/22/20   Lew Dawes, PA-C    Family History Family History  Problem Relation Age of Onset   Healthy Mother    Healthy Father     Social History Social History   Tobacco Use   Smoking status: Former   Smokeless tobacco: Never  Building services engineer Use: Never used  Substance Use Topics   Alcohol use: No   Drug use: Yes    Types: Marijuana     Allergies   Patient has no known allergies.   Review of Systems Review of Systems Per HPI  Physical Exam Triage Vital Signs ED Triage Vitals  Enc Vitals Group     BP 05/13/21 1204 122/72     Pulse Rate 05/13/21 1204 62     Resp 05/13/21 1204 18     Temp 05/13/21 1204 97.7 F (36.5 C)     Temp Source  05/13/21 1204 Oral     SpO2 05/13/21 1204 97 %     Weight 05/13/21 1207 165 lb (74.8 kg)     Height 05/13/21 1207 5\' 8"  (1.727 m)     Head Circumference --      Peak Flow --      Pain Score 05/13/21 1207 0     Pain Loc --      Pain Edu? --      Excl. in GC? --    No data found.  Updated Vital Signs BP 122/72 (BP Location: Right Arm)   Pulse 62   Temp 97.7 F (36.5 C) (Oral)   Resp 18   Ht 5\' 8"  (1.727 m)   Wt 165 lb (74.8 kg)   SpO2 97%   BMI 25.09 kg/m   Visual Acuity Right Eye Distance:   Left Eye Distance:   Bilateral Distance:    Right Eye Near:   Left Eye Near:    Bilateral Near:     Physical Exam Constitutional:      Appearance: Normal appearance.  HENT:     Head: Normocephalic and atraumatic.  Eyes:     Extraocular Movements: Extraocular movements intact.  Conjunctiva/sclera: Conjunctivae normal.  Pulmonary:     Effort: Pulmonary effort is normal.  Genitourinary:    Comments: Deferred with shared decision making. Self swab performed.  Neurological:     General: No focal deficit present.     Mental Status: He is alert and oriented to person, place, and time. Mental status is at baseline.  Psychiatric:        Mood and Affect: Mood normal.        Behavior: Behavior normal.        Thought Content: Thought content normal.        Judgment: Judgment normal.     UC Treatments / Results  Labs (all labs ordered are listed, but only abnormal results are displayed) Labs Reviewed  CYTOLOGY, (ORAL, ANAL, URETHRAL) ANCILLARY ONLY    EKG   Radiology No results found.  Procedures Procedures (including critical care time)  Medications Ordered in UC Medications - No data to display  Initial Impression / Assessment and Plan / UC Course  I have reviewed the triage vital signs and the nursing notes.  Pertinent labs & imaging results that were available during my care of the patient were reviewed by me and considered in my medical decision making (see  chart for details).     Will treat chlamydia exposure with doxycycline x10 days. Urethral swab pending. Advised patient to refrain from sexual activity until treatment is complete and symptoms have resolved. Discussed strict return precautions. Patient verbalized understanding and is agreeable with plan.  Final Clinical Impressions(s) / UC Diagnoses   Final diagnoses:  Exposure to chlamydia  Penile discharge  Screening examination for venereal disease     Discharge Instructions      You have been prescribed doxycycline antibiotic to treat chlamydia exposure. Your penile swab is pending. We will call if it is positive.      ED Prescriptions     Medication Sig Dispense Auth. Provider   doxycycline (VIBRAMYCIN) 100 MG capsule Take 1 capsule (100 mg total) by mouth 2 (two) times daily. 20 capsule Lance Muss, FNP      PDMP not reviewed this encounter.   Lance Muss, FNP 05/13/21 1229

## 2021-05-13 NOTE — ED Provider Notes (Signed)
Emergency Medicine Provider Triage Evaluation Note  Daniel [name] , a 25 y.o. male  was evaluated in triage.  Pt complains of exposure to chlamydia.  States that he would like to be treated for this however the urgent care he went to earlier today sent a doxycycline prescription to the pharmacy for him he states he read to the pharmacy and realized he cannot afford it..  Review of Systems  Positive: Penile discharge Negative: Fever  Physical Exam  BP 132/80 (BP Location: Right Arm)   Pulse (!) 51   Temp 98.1 F (36.7 C) (Oral)   Resp 15   SpO2 98%  Gen:   Awake, no distress   Resp:  Normal effort  MSK:   Moves extremities without difficulty  Other:    Medical Decision Making  Medically screening exam initiated at 2:14 PM.  Appropriate orders placed.  Rigel L Dastrup was informed that the remainder of the evaluation will be completed by another provider, this initial triage assessment does not replace that evaluation, and the importance of remaining in the ED until their evaluation is complete.  Patient is already had urethral swab obtained.  He is already been prescribed doxycycline.  I informed patient that he could either wait in the ER until we are able to provide him a room which may be numerous hours and give him a dose of azithromycin to treat his potential chlamydia or I could provide him with a good Rx coupon to fill the prescription for $10 at [facility].  He states he would like to wait.   Solon Augusta Oreland, [place] 05/13/21 1415    Lorre Nick, MD 05/16/21 [phone]

## 2021-05-13 NOTE — ED Triage Notes (Signed)
Patient with possible chlamydia exposure.  Patient noticed white discharge from penis.  No dysuria.

## 2021-05-13 NOTE — ED Notes (Signed)
Called pt x3 for room, no response. 

## 2021-05-13 NOTE — ED Provider Notes (Signed)
[facility] EMERGENCY DEPARTMENT Provider Note   CSN: [id number] Arrival date & time: 05/13/21  1308     History Chief Complaint  Patient presents with   Penile Discharge    Daniel [name] is a 25 y.o. male who presents with concern for 1 day of penile discharge that is white in color after recent exposure to partner who tested positive for chlamydia.  Denies any penile or scrotal pain, fevers, chills, nausea, vomiting, or diarrhea.  Denies any dysuria.  Patient states he was seen in urgent care earlier today and was tested for gonorrhea and chlamydia, however was not treated, the reason remains unclear.  I personally with patient medical records.  He has history of hernia repair and also history of hypospadias. He is not on any meds daily.  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 Authorizing Provider  doxycycline (VIBRAMYCIN) 100 MG capsule Take 1 capsule (100 mg total) by mouth 2 (two) times daily. 05/13/21  Yes Faven Watterson R, PA-C  fluticasone (FLONASE) 50 MCG/ACT nasal spray Place 1-2 sprays into both nostrils daily for 7 days. 04/22/20 04/29/20  Wieters, Hallie C, PA-C  ibuprofen (ADVIL) 800 MG tablet Take 1 tablet (800 mg total) by mouth 3 (three) times daily. Patient not taking: No sig reported 04/22/20   Wieters, Fran Lowes C, PA-C    Allergies    Patient has no  known allergies.  Review of Systems   Review of Systems  Constitutional: Negative.   HENT: Negative.    Eyes: Negative.   Respiratory: Negative.    Cardiovascular: Negative.   Gastrointestinal: Negative.   Genitourinary:  Positive for penile discharge. Negative for dysuria, flank pain, frequency, penile pain, penile swelling, scrotal swelling, testicular pain and urgency.  Musculoskeletal: Negative.   Neurological: Negative.    Physical Exam Updated Vital Signs BP 132/80 (BP Location: Right Arm)   Pulse (!) 51   Temp 98.1 F (36.7 C) (Oral)   Resp 15   SpO2 98%   Physical Exam Vitals and nursing note reviewed. Exam conducted with a chaperone present.  HENT:     Head: Normocephalic and atraumatic.  Eyes:     General: No scleral icterus.       Right eye: No discharge.        Left eye: No discharge.     Conjunctiva/sclera: Conjunctivae normal.  Pulmonary:     Effort: Pulmonary effort is normal.  Abdominal:     Palpations: Abdomen is soft.     Tenderness: There is no guarding or rebound.  Genitourinary:    Penis: Discharge present. No tenderness.      Testes: Normal.     Epididymis:     Right: Normal.  Left: Normal.     Comments: White to clear discharge from the urethra. without any other abnormalities on penile or scrotal exam  Lymphadenopathy:     Lower Body: No right inguinal adenopathy. No left inguinal adenopathy.  Skin:    General: Skin is warm and dry.  Neurological:     General: No focal deficit present.     Mental Status: He is alert.  Psychiatric:        Mood and Affect: Mood normal.    ED Results / Procedures / Treatments   Labs (all labs ordered are listed, but only abnormal results are displayed) Labs Reviewed - No data to display  EKG None  Radiology No results found.  Procedures Procedures   Medications Ordered in ED Medications  cefTRIAXone (ROCEPHIN) injection 500 mg (500 mg Intramuscular Given 05/13/21 1653)  doxycycline  (VIBRA-TABS) tablet 100 mg (100 mg Oral Given 05/13/21 1655)  sterile water (preservative free) injection (  Given 05/13/21 1655)    ED Course  I have reviewed the triage vital signs and the nursing notes.  Pertinent labs & imaging results that were available during my care of the patient were reviewed by me and considered in my medical decision making (see chart for details).    MDM Rules/Calculators/A&P                         25 year old male who presents with concern for penile discharge after exposure to chlamydia positive partner.  Differential diagnose includes not limited to STI including gonorrhea, chlamydia, trichomonas, urinary tract infection.  Vital signs are normal intake.,  Exam is normal, abdominal exam is benign.  GU exam performed the presence of chaperone did reveal a clear to white discharge coming from the urethra without any penile or scrotal tenderness to palpation or abnormality.  No inguinal lymphadenopathy.  Will treat patient presumptively for chlamydia and gonorrhea with Rocephin and outpatient doxycycline.  Good Rx coupon provided.  No further work-up warranted needed this time.  Safe sex practices encouraged, patient should not have intercourse or any sexual contact until completion of antibiotic therapy and test of cure.  Brennyn voiced understanding of his medical evaluation and treatment plan.  Each of his questions answered to his expressed satisfaction.  Strict return precautions are given.  Patient is well-appearing, stable, and appropriate for discharge at this time.  This chart was dictated using voice recognition software, Dragon. Despite the best efforts of this provider to proofread and correct errors, errors may still occur which can change documentation meaning.  Final Clinical Impression(s) / ED Diagnoses Final diagnoses:  None    Rx / DC Orders ED Discharge Orders          Ordered    doxycycline (VIBRAMYCIN) 100 MG capsule  2 times daily         05/13/21 1654             Dequon Schnebly, Eugene Gavia, PA-C 05/14/21 0038    Terrilee Files, MD 05/14/21 1105

## 2021-05-13 NOTE — Discharge Instructions (Addendum)
You have been prescribed doxycycline antibiotic to treat chlamydia exposure. Your penile swab is pending. We will call if it is positive.

## 2021-05-13 NOTE — Discharge Instructions (Addendum)
You were seen in the ER today for your penile discharge after exposure to chlamydia.  You have been treated with an antibiotic to cover you for gonorrhea in the emergency department and have been prescribed an antibiotic to take at home for the next 10 days to treat for chlamydia.  Please take this as prescribed for the entire course, and be aware this antibiotic may make you more sensitive to the sun.  You should not have any kind of sexual contact until you have completed your antibiotics and had a test of cure to confirm resolution of your chlamydia infection.  Please follow your MyChart app for the results of the STI swab you had performed earlier today.  Continue safe sex practices.  Return to ER with any new severe symptoms.

## 2021-05-15 LAB — CYTOLOGY, (ORAL, ANAL, URETHRAL) ANCILLARY ONLY
Chlamydia: POSITIVE — AB
Comment: NEGATIVE
Comment: NEGATIVE
Comment: NORMAL
Neisseria Gonorrhea: POSITIVE — AB
Trichomonas: NEGATIVE

## 2021-08-21 ENCOUNTER — Other Ambulatory Visit: Payer: Self-pay

## 2021-08-21 ENCOUNTER — Ambulatory Visit (HOSPITAL_COMMUNITY)
Admission: RE | Admit: 2021-08-21 | Discharge: 2021-08-21 | Disposition: A | Discharge status: Home / Self Care | Payer: Self-pay | Source: Ambulatory Visit | Attending: Emergency Medicine | Admitting: Emergency Medicine

## 2021-08-21 ENCOUNTER — Encounter (HOSPITAL_COMMUNITY): Payer: Self-pay

## 2021-08-21 VITALS — BP 147/83 | HR 66 | Temp 98.6°F | Resp 18

## 2021-08-21 DIAGNOSIS — R369 Urethral discharge, unspecified: Secondary | ICD-10-CM | POA: Insufficient documentation

## 2021-08-21 MED ORDER — DOXYCYCLINE HYCLATE 100 MG PO CAPS
100.0000 mg | ORAL_CAPSULE | Freq: Two times a day (BID) | ORAL | 0 refills | Status: AC
Start: 2021-08-21 — End: 2021-08-28

## 2021-08-21 MED ORDER — DOXYCYCLINE HYCLATE 100 MG PO CAPS
100.0000 mg | ORAL_CAPSULE | Freq: Two times a day (BID) | ORAL | 0 refills | Status: DC
Start: 2021-08-21 — End: 2021-08-21

## 2021-08-21 NOTE — Discharge Instructions (Addendum)
Good rx, search vibramycin 100 mg, 14 tablets    Labs pending 2-3 days, you will be contacted if positive for any sti and treatment will be sent to the pharmacy, you will have to return to the clinic if positive for gonorrhea to receive treatment   Please refrain from having sex until labs results, if positive please refrain from having sex until treatment complete and symptoms resolve   If positive for HIV, Syphilis, Chlamydia  gonorrhea or trichomoniasis please notify partner or partners so they may tested as well  Moving forward, it is recommended you use some form of protection against the transmission of sti infections  such as condoms or dental dams with each sexual encounter

## 2021-08-21 NOTE — ED Provider Notes (Signed)
[facility]    CSN: [id number] Arrival date & time: 08/21/21  1541      History   Chief Complaint Chief Complaint  Patient presents with   Penile Discharge    X 2 DAYS    HPI Daniel [name] is a 25 y.o. male.   Patient present with Daniel [name] cloudy discharge for 2 days. Endorses condom broke during last sexual encounter. Sexually active, 1 partner, always condom use.  Denies penile pain, penile or testicle swelling, urinary frequency, urgency, dysuria, hematuria, abdominal pain, flank pain, fever, chills.  No known exposure.  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 Authorizing Provider  doxycycline (VIBRAMYCIN) 100 MG capsule Take 1 capsule (100 mg total) by mouth 2 (two) times daily. 05/13/21   Sponseller, Lupe Carney R, PA-C  fluticasone (FLONASE) 50 MCG/ACT nasal spray Place 1-2 sprays into both nostrils daily for 7 days. 04/22/20 04/29/20  Wieters, Hallie C, PA-C  ibuprofen (ADVIL) 800 MG tablet Take 1 tablet (800 mg total) by mouth 3 (three) times daily. Patient not taking: Reported on 05/01/2020 04/22/20   Lew Dawes, PA-C    Family History Family History  Problem Relation Age of Onset   Healthy Mother    Healthy Father     Social History Social History   Tobacco Use   Smoking status: Former   Smokeless tobacco: Never  Building services engineer Use: Never used  Substance Use Topics   Alcohol use: No   Drug use: Yes    Types: Marijuana     Allergies   Patient has no known allergies.   Review of Systems Review of Systems  Constitutional: Negative.   Respiratory: Negative.    Cardiovascular: Negative.   Genitourinary:  Positive for penile discharge. Negative for  difficulty urinating, dysuria, enuresis, flank pain, frequency, genital sores, hematuria, penile pain, penile swelling, scrotal swelling, testicular pain and urgency.  Skin: Negative.     Physical Exam Triage Vital Signs ED Triage Vitals  Enc Vitals Group     BP 08/21/21 1604 (!) 147/83     Pulse Rate 08/21/21 1604 66     Resp 08/21/21 1604 18     Temp 08/21/21 1604 98.6 F (37 C)     Temp Source 08/21/21 1604 Oral     SpO2 08/21/21 1604 100 %     Weight --      Height --      Head Circumference --      Peak Flow --      Pain Score 08/21/21 1605 0     Pain Loc --      Pain Edu? --      Excl. in [place]? --    No data found.  Updated Vital Signs BP (!) 147/83 (BP Location: Left Arm)   Pulse 66   Temp 98.6 F (37 C) (Oral)   Resp 18   SpO2 100%   Visual Acuity Right Eye Distance:   Left Eye Distance:   Bilateral Distance:    Right Eye Near:   Left Eye Near:    Bilateral Near:     Physical Exam  Constitutional:      Appearance: Normal appearance. He is normal weight.  HENT:     Head: Normocephalic.  Eyes:     Extraocular Movements: Extraocular movements intact.  Pulmonary:     Effort: Pulmonary effort is normal.  Genitourinary:    Comments: Deferred self collect urethral swab Skin:    General: Skin is warm and dry.  Neurological:     General: No focal deficit present.     Mental Status: He is alert and oriented to person, place, and time. Mental status is at baseline.  Psychiatric:        Mood and Affect: Mood normal.        Behavior: Behavior normal.     UC Treatments / Results  Labs (all labs ordered are listed, but only abnormal results are displayed) Labs Reviewed - No data to display  EKG   Radiology No results found.  Procedures Procedures (including critical care time)  Medications Ordered in UC Medications - No data to display  Initial Impression / Assessment and Plan / UC Course  I have reviewed the triage vital signs and the nursing  notes.  Pertinent labs & imaging results that were available during my care of the patient were reviewed by me and considered in my medical decision making (see chart for details).  Penile discharge  Will prophylactically treat for chlamydia based on symptomology  1.  Doxycycline 100 mg twice daily as needed 2.  Urethral swab pending, will treat per protocol, advised abstinence until lab results, treatment is complete and all symptoms have resolved, advised continued condom use with every encounter  Final Clinical Impressions(s) / UC Diagnoses   Final diagnoses:  None   Discharge Instructions   None    ED Prescriptions   None    PDMP not reviewed this encounter.   Hans Eden, NP 08/21/21 1630

## 2021-08-21 NOTE — ED Triage Notes (Signed)
Pt presents for penile discharge for x 2 days.

## 2021-08-22 LAB — CYTOLOGY, (ORAL, ANAL, URETHRAL) ANCILLARY ONLY
Chlamydia: POSITIVE — AB
Comment: NEGATIVE
Comment: NEGATIVE
Comment: NORMAL
Neisseria Gonorrhea: NEGATIVE
Trichomonas: NEGATIVE

## 2021-09-22 ENCOUNTER — Other Ambulatory Visit: Payer: Self-pay

## 2021-09-22 ENCOUNTER — Ambulatory Visit (HOSPITAL_COMMUNITY)
Admission: RE | Admit: 2021-09-22 | Discharge: 2021-09-22 | Disposition: A | Discharge status: Home / Self Care | Payer: Medicaid Other | Source: Ambulatory Visit | Attending: Emergency Medicine | Admitting: Emergency Medicine

## 2021-09-22 VITALS — BP 132/95 | HR 72 | Temp 98.6°F | Resp 16

## 2021-09-22 DIAGNOSIS — R369 Urethral discharge, unspecified: Secondary | ICD-10-CM | POA: Insufficient documentation

## 2021-09-22 LAB — POCT URINALYSIS DIPSTICK, ED / UC
Bilirubin Urine: NEGATIVE
Glucose, UA: NEGATIVE mg/dL
Ketones, ur: NEGATIVE mg/dL
Leukocytes,Ua: NEGATIVE
Nitrite: NEGATIVE
Protein, ur: NEGATIVE mg/dL
Specific Gravity, Urine: >=1.03 (ref 1.005–1.030)
Urobilinogen, UA: 2 mg/dL — ABNORMAL HIGH (ref 0.0–1.0)
pH: 7 (ref 5.0–8.0)

## 2021-09-22 MED ORDER — CEFTRIAXONE SODIUM 500 MG IJ SOLR
500.0000 mg | Freq: Once | INTRAMUSCULAR | Status: AC
  Administered 2021-09-22: 500 mg via INTRAMUSCULAR

## 2021-09-22 MED ORDER — CEFTRIAXONE SODIUM 500 MG IJ SOLR
INTRAMUSCULAR | Status: AC
  Filled 2021-09-22: qty 500

## 2021-09-22 MED ORDER — LIDOCAINE HCL (PF) 1 % IJ SOLN
INTRAMUSCULAR | Status: AC
  Filled 2021-09-22: qty 2

## 2021-09-22 NOTE — ED Provider Notes (Signed)
[facility]    CSN: [id number] Arrival date & time: 09/22/21  1042      History   Chief Complaint Chief Complaint  Patient presents with   Penile Discharge    HPI Daniel [name] is a 26 y.o. male.   Patient presents with thick Daniel [name] discharge with associated dysuria.  For 2 days after having unprotected sex 2 weeks ago.  No known exposure.  1 partner currently sometimes condom use.  Denies urinary frequency urgency, hematuria, abdominal pain, flank pain, new rash or lesions, penile or testicle swelling.  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 Authorizing Provider  fluticasone (FLONASE) 50 MCG/ACT nasal spray Place 1-2 sprays into both nostrils daily for 7 days. 04/22/20 04/29/20  Wieters, Hallie C, PA-C  ibuprofen (ADVIL) 800 MG tablet Take 1 tablet (800 mg total) by mouth 3 (three) times daily. Patient not taking: Reported on 05/01/2020 04/22/20   Janith Lima, PA-C    Family History Family History  Problem Relation Age of Onset   Healthy Mother    Healthy Father     Social History Social History   Tobacco Use   Smoking status: Former   Smokeless tobacco: Never  Scientific laboratory technician Use: Never used  Substance Use Topics   Alcohol use: No   Drug use: Yes    Types: Marijuana     Allergies   Patient has no known allergies.   Review of Systems Review of Systems  Constitutional: Negative.   Genitourinary:  Positive for dysuria and penile discharge. Negative for decreased urine volume, difficulty urinating, enuresis, flank pain, frequency, genital sores, hematuria, penile pain, penile swelling, scrotal swelling, testicular pain and urgency.  Skin: Negative.      Physical Exam Triage Vital Signs ED Triage Vitals  Enc Vitals Group     BP 09/22/21 1119 (!) 132/95     Pulse Rate 09/22/21 1119 72     Resp 09/22/21 1119 16     Temp 09/22/21 1119 98.6 F (37 C)     Temp Source 09/22/21 1119 Oral     SpO2 09/22/21 1119 98 %     Weight --      Height --      Head Circumference --      Peak Flow --      Pain Score 09/22/21 1120 0     Pain Loc --      Pain Edu? --      Excl. in [place]? --    No data found.  Updated Vital Signs BP (!) 132/95 (BP Location: Left Arm)    Pulse 72    Temp 98.6 F (37 C) (Oral)    Resp 16    SpO2 98%   Visual Acuity Right Eye Distance:   Left Eye Distance:   Bilateral Distance:    Right Eye Near:   Left Eye Near:    Bilateral Near:     Physical Exam Constitutional:      Appearance: Normal appearance.  HENT:     Head: Normocephalic.  Eyes:     Extraocular Movements: Extraocular movements intact.  Pulmonary:     Effort: Pulmonary effort is normal.  Genitourinary:    Comments: Deferred, self collect urethra swab  Skin:    General: Skin is warm and dry.  Neurological:     Mental Status: He is alert and oriented to person, place, and time. Mental status is at baseline.  Psychiatric:        Mood and Affect: Mood normal.        Behavior: Behavior normal.     UC Treatments / Results  Labs (all labs ordered are listed, but only abnormal results are displayed) Labs Reviewed  CYTOLOGY, (ORAL, ANAL, URETHRAL) ANCILLARY ONLY    EKG   Radiology No results found.  Procedures Procedures (including critical care time)  Medications Ordered in UC Medications - No data to display  Initial Impression / Assessment and Plan / UC Course  I have reviewed the triage vital signs and the nursing notes.  Pertinent labs & imaging results that were available during my care of the patient were reviewed by me and considered in my medical decision making (see chart for details).  Penile discharge  Will  prophylactically treat for gonorrhea today, Rocephin 500 mg IM injection given in office, STI screening pending, declined HIV and syphilis testing, will treat remaining infections per protocol, advised abstinence for the next 7 days, until lab results and any further treatment is needed and all symptoms has resolved, advised condom use with all sexual encounters moving forward Final Clinical Impressions(s) / UC Diagnoses   Final diagnoses:  None   Discharge Instructions   None    ED Prescriptions   None    PDMP not reviewed this encounter.   Hans Eden, NP 09/22/21 1143

## 2021-09-22 NOTE — Discharge Instructions (Addendum)
You are being treated today for gonorrhea   Labs pending 2-3 days, you will be contacted if positive for any sti and treatment will be sent to the pharmacy, you will have to return to the clinic if positive for gonorrhea to receive treatment   Please refrain from having sex until labs results, if positive please refrain from having sex until treatment complete and symptoms resolve   If positive for , Chlamydia  gonorrhea or trichomoniasis please notify partner or partners so they may tested as well  Moving forward, it is recommended you use some form of protection against the transmission of sti infections  such as condoms or dental dams with each sexual encounter

## 2021-09-22 NOTE — ED Triage Notes (Signed)
Pt presents for penile discharge x 2 days. He does report some dysuira.

## 2021-09-23 LAB — CYTOLOGY, (ORAL, ANAL, URETHRAL) ANCILLARY ONLY
Chlamydia: NEGATIVE
Comment: NEGATIVE
Comment: NEGATIVE
Comment: NORMAL
Neisseria Gonorrhea: NEGATIVE
Trichomonas: NEGATIVE

## 2021-10-03 ENCOUNTER — Encounter (HOSPITAL_COMMUNITY): Payer: Self-pay

## 2021-10-03 ENCOUNTER — Ambulatory Visit (HOSPITAL_COMMUNITY)
Admission: EM | Admit: 2021-10-03 | Discharge: 2021-10-03 | Disposition: A | Discharge status: Home / Self Care | Payer: Medicaid Other | Attending: Emergency Medicine | Admitting: Emergency Medicine

## 2021-10-03 ENCOUNTER — Other Ambulatory Visit: Payer: Self-pay

## 2021-10-03 DIAGNOSIS — S0181XA Laceration without foreign body of other part of head, initial encounter: Secondary | ICD-10-CM

## 2021-10-03 NOTE — ED Triage Notes (Signed)
Pt presents with small laceration on right eyebrow after door caught him when it was closing this evening.

## 2021-10-03 NOTE — Discharge Instructions (Signed)
Skin adhesive was applied to your laceration, this will fall off over time, if this occurs and area has not begun to scab over you may use glue that you were given to reapply  Please watch for signs of infection including increased tenderness, increased swelling, drainage, random fever or chills, if this occurs please return to urgent care for reevaluation  Your laceration will heal on its own typically without complication, due to the death of your laceration it most likely will cause a small scar

## 2021-10-06 NOTE — ED Provider Notes (Signed)
[facility]    CSN: [id number] Arrival date & time: 10/03/21  1953      History   Chief Complaint Chief Complaint  Patient presents with   Laceration    HPI Daniel [name] is a 26 y.o. male.   Patient presents with facial laceration to outer left eyebrow occurring within the last 1 to 2 hours after face collided with end of door frame. Small amount of blood present. Has not attempted treatment.  Denies blurred vision, floaters, light sensitivity, eye pain, redness.   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 Authorizing Provider  fluticasone (FLONASE) 50 MCG/ACT nasal spray Place 1-2 sprays into both nostrils daily for 7 days. 04/22/20 04/29/20  Wieters, Hallie C, PA-C  ibuprofen (ADVIL) 800 MG tablet Take 1 tablet (800 mg total) by mouth 3 (three) times daily. Patient not taking: Reported on 05/01/2020 04/22/20   Lew Dawes, PA-C    Family History Family History  Problem Relation Age of Onset   Healthy Mother    Healthy Father     Social History Social History   Tobacco Use   Smoking status: Former   Smokeless tobacco: Never  Building services engineer Use: Never used  Substance Use Topics   Alcohol use: No   Drug use: Yes    Types: Marijuana     Allergies   Patient has no known allergies.   Review of Systems Review of Systems  Constitutional: Negative.   Respiratory: Negative.    Cardiovascular: Negative.     Physical Exam Triage Vital Signs ED Triage Vitals  Enc Vitals Group     BP 10/03/21 2015 122/71     Pulse Rate 10/03/21 2015 66     Resp --      Temp 10/03/21 2015 98.3 F (36.8 C)     Temp Source 10/03/21 2015 Oral     SpO2 10/03/21 2015 97 %      Weight --      Height --      Head Circumference --      Peak Flow --      Pain Score 10/03/21 2018 3     Pain Loc --      Pain Edu? --      Excl. in [place]? --    No data found.  Updated Vital Signs BP 122/71 (BP Location: Left Arm)    Pulse 66    Temp 98.3 F (36.8 C) (Oral)    SpO2 97%   Visual Acuity Right Eye Distance:   Left Eye Distance:   Bilateral Distance:    Right Eye Near:   Left Eye Near:    Bilateral Near:     Physical Exam Constitutional:      Appearance: Normal appearance.  HENT:     Head: Normocephalic.  Eyes:     Extraocular Movements: Extraocular movements intact.     Comments: No tenderness or swelling noted to the periorbital of the left eye, visual tracking intact, extraocular movements intact  Pulmonary:     Effort: Pulmonary effort is normal.  Skin:    Comments: 1 cm  laceration present to the lateral aspect of the left eyebrow  Neurological:     Mental Status: He is alert and oriented to person, place, and time. Mental status is at baseline.  Psychiatric:        Mood and Affect: Mood normal.        Behavior: Behavior normal.     UC Treatments / Results  Labs (all labs ordered are listed, but only abnormal results are displayed) Labs Reviewed - No data to display  EKG   Radiology No results found.  Procedures Laceration Repair  Date/Time: 10/06/2021 8:16 AM Performed by: Valinda Hoar, NP Authorized by: Valinda Hoar, NP   Consent:    Consent obtained:  Verbal   Consent given by:  Patient   Risks, benefits, and alternatives were discussed: yes     Risks discussed:  Infection and pain   Alternatives discussed:  No treatment Universal protocol:    Procedure explained and questions answered to patient or proxy's satisfaction: yes     Patient identity confirmed:  Verbally with patient Anesthesia:    Anesthesia method:  None Laceration details:    Location:  Face   Face location:  L eyebrow   Length (cm):   1 Pre-procedure details:    Preparation:  Patient was prepped and draped in usual sterile fashion Treatment:    Area cleansed with:  Chlorhexidine   Amount of cleaning:  Standard   Debridement:  None Skin repair:    Repair method:  Tissue adhesive Approximation:    Approximation:  Close Repair type:    Repair type:  Simple Post-procedure details:    Dressing:  Open (no dressing)   Procedure completion:  Tolerated (including critical care time)  Medications Ordered in UC Medications - No data to display  Initial Impression / Assessment and Plan / UC Course  I have reviewed the triage vital signs and the nursing notes.  Pertinent labs & imaging results that were available during my care of the patient were reviewed by me and considered in my medical decision making (see chart for details).  Facial laceration, initial encounter  Discussed options for repair such as sutures versus adhesive, patient has chosen to move forward with adhesion, advised patient to keep area from becoming soaking wet, can cleanse with diluted soapy water, pat dry and leave open to air, low suspicion for complication, no signs of involvement of the left eye, given signs for infection and when to return to urgent care, may follow-up as needed Final Clinical Impressions(s) / UC Diagnoses   Final diagnoses:  Facial laceration, initial encounter     Discharge Instructions      Skin adhesive was applied to your laceration, this will fall off over time, if this occurs and area has not begun to scab over you may use glue that you were given to reapply  Please watch for signs of infection including increased tenderness, increased swelling, drainage, random fever or chills, if this occurs please return to urgent care for reevaluation  Your laceration will heal on its own typically without complication, due to the death of your laceration it most likely will cause a small scar   ED Prescriptions   None     PDMP not reviewed this encounter.   Valinda Hoar, [place] 10/06/21 [phone]

## 2021-12-12 ENCOUNTER — Other Ambulatory Visit: Payer: Self-pay

## 2021-12-12 ENCOUNTER — Ambulatory Visit
Admission: EM | Admit: 2021-12-12 | Discharge: 2021-12-12 | Disposition: A | Discharge status: Home / Self Care | Payer: Medicaid Other | Attending: Emergency Medicine | Admitting: Emergency Medicine

## 2021-12-12 DIAGNOSIS — J020 Streptococcal pharyngitis: Secondary | ICD-10-CM

## 2021-12-12 LAB — POCT RAPID STREP A (OFFICE): Rapid Strep A Screen: POSITIVE — AB

## 2021-12-12 MED ORDER — IBUPROFEN 400 MG PO TABS
400.0000 mg | ORAL_TABLET | Freq: Three times a day (TID) | ORAL | 0 refills | Status: AC | PRN
Start: 2021-12-12 — End: ?

## 2021-12-12 MED ORDER — CEFDINIR 300 MG PO CAPS: 300.0000 mg | ORAL_CAPSULE | Freq: Two times a day (BID) | ORAL | 0 refills | Status: AC

## 2021-12-12 MED ORDER — LIDOCAINE VISCOUS HCL 2 % MT SOLN: 15.0000 mL | OROMUCOSAL | 0 refills | Status: AC | PRN

## 2021-12-12 NOTE — ED Provider Notes (Signed)
?[facility] ? ? ? ?CSN: [id number] ?Arrival date & time: 12/12/21  1504 ?  ? ?HISTORY  ? ?Chief Complaint  ?Patient presents with  ? Sore Throat  ? ?HPI ?Daniel [name] is a 26 y.o. male. Pt presents with sore throat, fever, chills, and bodyaches x 2 days.  Patient is afebrile on arrival.  Patient states has not tried any medications for his symptoms.  Patient denies known sick contacts.  Patient denies upset stomach, headache, cough, runny nose, congestion. ? ?The history is provided by the patient.  ?Past Medical History:  ?Diagnosis Date  ? Heart murmur   ? ?Patient Active Problem List  ? Diagnosis Date Noted  ? 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 ?Past Surgical History:  ?Procedure Laterality Date  ? HERNIA REPAIR    ? ? ?Home Medications   ? ?Prior to Admission medications   ?Not on File  ? ?Family History ?Family History  ?Problem Relation Age of Onset  ? Healthy Mother   ? Healthy Father   ? ?Social History ?Social History  ? ?Tobacco Use  ? Smoking status: Former  ? Smokeless tobacco: Never  ?Vaping Use  ? Vaping Use: Never used  ?Substance Use Topics  ? Alcohol use: No  ? Drug use: Yes  ?  Types: Marijuana  ? ?Allergies   ?Patient has no known allergies. ? ?Review of Systems ?Review of Systems ?Pertinent findings noted in history of present illness.  ? ?Physical Exam ?Triage Vital Signs ?ED Triage Vitals  ?Enc Vitals Group  ?   BP 07/14/21 0827 (!) 147/82  ?   Pulse Rate 07/14/21 0827 72  ?   Resp 07/14/21 0827 18  ?   Temp 07/14/21 0827 98.3 ?F (36.8 ?C)  ?   Temp Source 07/14/21 0827 Oral  ?   SpO2 07/14/21 0827 98 %  ?   Weight --   ?   Height --   ?   Head Circumference --   ?   Peak Flow --   ?   Pain Score 07/14/21 0826 5  ?   Pain Loc --   ?   Pain Edu? --   ?   Excl. in GC? --   ?No data found. ? ?Updated Vital Signs ?BP 139/87 (BP Location: Right Arm)   Pulse 72   Temp 98.7 ?F (37.1 ?C) (Oral)   Resp 18   SpO2 96%  ? ?Physical Exam ?Constitutional:   ?   General: He is not in acute  distress. ?   Appearance: He is well-developed. He is ill-appearing. He is not toxic-appearing.  ?HENT:  ?   Head: Normocephalic and atraumatic.  ?   Salivary Glands: Right salivary gland is diffusely enlarged and tender. Left salivary gland is diffusely enlarged and tender.  ?   Right Ear: Hearing and external ear normal.  ?   Left Ear: Hearing and external ear normal.  ?   Ears:  ?   Comments: Bilateral EACs with mild erythema, bilateral TMs are normal ?   Nose: No mucosal edema, congestion or rhinorrhea.  ?   Right Turbinates: Not enlarged, swollen or pale.  ?   Left Turbinates: Not enlarged or swollen.  ?   Right Sinus: No maxillary sinus tenderness or frontal sinus tenderness.  ?   Left Sinus: No maxillary sinus tenderness or frontal sinus tenderness.  ?   Mouth/Throat:  ?   Lips: Pink. No lesions.  ?   Mouth:  Mucous membranes are moist. No oral lesions or angioedema.  ?   Dentition: No gingival swelling.  ?   Tongue: No lesions.  ?   Palate: No mass.  ?   Pharynx: Uvula midline. Pharyngeal swelling, oropharyngeal exudate and posterior oropharyngeal erythema present. No uvula swelling.  ?   Tonsils: Tonsillar exudate present. 2+ on the right. 2+ on the left.  ?Eyes:  ?   Extraocular Movements: Extraocular movements intact.  ?   Conjunctiva/sclera: Conjunctivae normal.  ?   Pupils: Pupils are equal, round, and reactive to light.  ?Neck:  ?   Thyroid: No thyroid mass, thyromegaly or thyroid tenderness.  ?   Trachea: Tracheal tenderness present. No abnormal tracheal secretions or tracheal deviation.  ?   Comments: Voice is muffled ?Cardiovascular:  ?   Rate and Rhythm: Normal rate and regular rhythm.  ?   Pulses: Normal pulses.  ?   Heart sounds: Normal heart sounds, S1 normal and S2 normal. No murmur heard. ?  No friction rub. No gallop.  ?Pulmonary:  ?   Effort: Pulmonary effort is normal. No accessory muscle usage, prolonged expiration, respiratory distress or retractions.  ?   Breath sounds: No stridor,  decreased air movement or transmitted upper airway sounds. No decreased breath sounds, wheezing, rhonchi or rales.  ?Abdominal:  ?   General: Bowel sounds are normal.  ?   Palpations: Abdomen is soft.  ?   Tenderness: There is generalized abdominal tenderness. There is no right CVA tenderness, left CVA tenderness or rebound. Negative signs include Murphy's sign.  ?   Hernia: No hernia is present.  ?Musculoskeletal:     ?   General: No tenderness. Normal range of motion.  ?   Cervical back: Full passive range of motion without pain, normal range of motion and neck supple.  ?   Right lower leg: No edema.  ?   Left lower leg: No edema.  ?Lymphadenopathy:  ?   Cervical: Cervical adenopathy present.  ?   Right cervical: Superficial cervical adenopathy present.  ?   Left cervical: Superficial cervical adenopathy present.  ?Skin: ?   General: Skin is warm and dry.  ?   Findings: No erythema, lesion or rash.  ?Neurological:  ?   General: No focal deficit present.  ?   Mental Status: He is alert and oriented to person, place, and time. Mental status is at baseline.  ?Psychiatric:     ?   Mood and Affect: Mood normal.     ?   Behavior: Behavior normal.     ?   Thought Content: Thought content normal.     ?   Judgment: Judgment normal.  ? ? ?Visual Acuity ?Right Eye Distance:   ?Left Eye Distance:   ?Bilateral Distance:   ? ?Right Eye Near:   ?Left Eye Near:    ?Bilateral Near:    ? ?UC Couse / Diagnostics / Procedures:  ?  ?EKG ? ?Radiology ?No results found. ? ?Procedures ?Procedures (including critical care time) ? ?UC Diagnoses / Final Clinical Impressions(s)   ?I have reviewed the triage vital signs and the nursing notes. ? ?Pertinent labs & imaging results that were available during my care of the patient were reviewed by me and considered in my medical decision making (see chart for details).   ?Final diagnoses:  ?Streptococcal pharyngitis  ? ?Patient provided with a prescription for cefdinir, lidocaine and ibuprofen.   Return precautions advised. ? ?ED Prescriptions   ? ?  Medication Sig Dispense Auth. Provider  ? cefdinir (OMNICEF) 300 MG capsule Take 1 capsule (300 mg total) by mouth 2 (two) times daily for 10 days. 20 capsule Theadora RamaMorgan, [place] Doig Scales, PA-C  ? ibuprofen (ADVIL) 400 MG tablet Take 1 tablet (400 mg total) by mouth every 8 (eight) hours as needed for up to 30 doses. 30 tablet Theadora RamaMorgan, Maleka Contino Scales, PA-C  ? lidocaine (XYLOCAINE) 2 % solution Use as directed 15 mLs in the mouth or throat every 3 (three) hours as needed for mouth pain (Sore throat). 300 mL Theadora RamaMorgan, Nazareth Kirk Scales, PA-C  ? ?  ? ?PDMP not reviewed this encounter. ? ?Pending results:  ?Labs Reviewed  ?POCT RAPID STREP A (OFFICE) - Abnormal; Notable for the following components:  ?    Result Value  ? Rapid Strep A Screen Positive (*)   ? All other components within normal limits  ? ? ?Medications Ordered in UC: ?Medications - No data to display ? ?Disposition Upon Discharge:  ?Condition: stable for discharge home ?Home: take medications as prescribed; routine discharge instructions as discussed; follow up as advised. ? ?Patient presented with an acute illness with associated systemic symptoms and significant discomfort requiring urgent management. In my opinion, this is a condition that a prudent lay person (someone who possesses an average knowledge of health and medicine) may potentially expect to result in complications if not addressed urgently such as respiratory distress, impairment of bodily function or dysfunction of bodily organs.  ? ?Routine symptom specific, illness specific and/or disease specific instructions were discussed with the patient and/or caregiver at length.  ? ?As such, the patient has been evaluated and assessed, work-up was performed and treatment was provided in alignment with urgent care protocols and evidence based medicine.  Patient/parent/caregiver has been advised that the patient may require follow up for further testing and  treatment if the symptoms continue in spite of treatment, as clinically indicated and appropriate. ? ?If the patient was tested for COVID-19, Influenza and/or RSV, then the patient/parent/guardian was advised t

## 2021-12-12 NOTE — ED Triage Notes (Signed)
Pt presents with sore throat, fever, chills, and bodyaches x 2 days.  ?

## 2021-12-12 NOTE — Discharge Instructions (Addendum)
Your rapid strep test today is positive.  Please begin antibiotics now.  A prescription has been sent to your pharmacy.  Please take all doses as prescribed.   ? ?Please discard your toothbrush and any other oral devices that you are currently using and replace them with new ones.   ? ?After taking antibiotics for 24 hours, you are no longer considered contagious and should begin to feel much better.   ?  ?Please see the list below for recommended medications, dosages and frequencies to provide relief of your current symptoms:   ?  ?Cefdinir (Omnicef):  1 capsule twice daily for 10 days, you can take it with or without food.  This antibiotic can cause upset stomach, this will resolve once antibiotics are complete.  You are welcome to use a probiotic, eat yogurt, take Imodium while taking this medication.  Please avoid other systemic medications such as Maalox, Pepto-Bismol or milk of magnesia as they can interfere with your body's ability to absorb the antibiotics. ?  ?Ibuprofen  (Advil, Motrin): This is a good anti-inflammatory medication which addresses aches, pains and inflammation of the upper airways that causes sinus and nasal congestion as well as in the lower airways which makes your cough feel tight and sometimes burn.  I recommend that you take between 400 to 600 mg every 6-8 hours as needed, I have provided you with a prescription for 400 mg.    ?  ?Lidocaine (Xylocaine): This is a numbing medication that can be swished for 15 seconds and swallowed.  You can use this every 3 hours while awake to relieve pain in your mouth and throat.  I have sent a prescription for this medication to your pharmacy. ?  ?Please follow-up within the next 7-10 days either with your primary care provider or urgent care if your symptoms do not resolve.  If you do not have a primary care provider, we will assist you in finding one. ?  ?Thank you for visiting urgent care today.  We appreciate the opportunity to participate in  your care. ? ?

## 2022-01-22 ENCOUNTER — Ambulatory Visit (HOSPITAL_COMMUNITY)
Admission: RE | Admit: 2022-01-22 | Discharge: 2022-01-22 | Disposition: A | Discharge status: Home / Self Care | Payer: Self-pay | Source: Ambulatory Visit | Attending: Family Medicine | Admitting: Family Medicine

## 2022-01-22 ENCOUNTER — Encounter (HOSPITAL_COMMUNITY): Payer: Self-pay

## 2022-01-22 VITALS — BP 121/67 | HR 79 | Temp 98.7°F | Resp 18

## 2022-01-22 DIAGNOSIS — Z711 Person with feared health complaint in whom no diagnosis is made: Secondary | ICD-10-CM | POA: Insufficient documentation

## 2022-01-22 DIAGNOSIS — R369 Urethral discharge, unspecified: Secondary | ICD-10-CM | POA: Insufficient documentation

## 2022-01-22 MED ORDER — CEFTRIAXONE SODIUM 500 MG IJ SOLR
500.0000 mg | Freq: Once | INTRAMUSCULAR | Status: AC
  Administered 2022-01-22: 500 mg via INTRAMUSCULAR

## 2022-01-22 MED ORDER — DOXYCYCLINE HYCLATE 100 MG PO CAPS: 100.0000 mg | ORAL_CAPSULE | Freq: Two times a day (BID) | ORAL | 0 refills | Status: DC

## 2022-01-22 MED ORDER — CEFTRIAXONE SODIUM 500 MG IJ SOLR
INTRAMUSCULAR | Status: AC
  Filled 2022-01-22: qty 500

## 2022-01-22 NOTE — ED Triage Notes (Signed)
Pt is present today with penile discharge and dysuria  Pt sx started x2 days ago ?

## 2022-01-22 NOTE — ED Provider Notes (Signed)
?  [facility] ? ? ?[id number] ?01/22/22 Arrival Time: [id number] ? ?ASSESSMENT & PLAN: ? ?1. Penile discharge   ?2. Concern about STD in male without diagnosis   ? ? ? ? ?Discharge Instructions   ? ?  ?You have been given the following today for treatment of suspected gonorrhea and/or chlamydia: ? ?cefTRIAXone (ROCEPHIN) injection 500 mg ? ?Please pick up your prescription for doxycycline 100 mg and begin taking twice daily for the next seven (7) days. ? ?Even though we have treated you today, we have sent testing for sexually transmitted infections. We will notify you of any positive results once they are received. If required, we will prescribe any medications you might need. ? ?Please refrain from all sexual activity for at least the next seven days. ? ? ? ? ?Pending: ?Urethral cytology. ? ?Will notify of any positive results. Instructed to refrain from sexual activity for at least seven days. ? ?Reviewed expectations re: course of current medical issues. Questions answered. ?Outlined signs and symptoms indicating need for more acute intervention. ?Patient verbalized understanding. ?After Visit Summary given. ? ? ?SUBJECTIVE: ? ?Daniel [name] is a 26 y.o. male who presents with complaint of penile discharge. Onset gradual. First noticed  few d ago . Describes discharge as thick and opaque. No specific aggravating or alleviating factors reported. Denies: urinary frequency, dysuria, gross hematuria, and urinary incontinence. Afebrile. No abdominal or pelvic pain. No n/v. No rashes or lesions. Reports that he is sexually active with single male partner. ? ?OBJECTIVE: ? ?Vitals:  ? 01/22/22 1101  ?BP: 121/67  ?Pulse: 79  ?Resp: 18  ?Temp: 98.7 ?F (37.1 ?C)  ?SpO2: 96%  ?  ? ?General appearance: alert, cooperative, appears stated age and no distress ?Throat: lips, mucosa, and tongue normal; teeth and gums normal ?Lungs: unlabored respirations; speaks full sentences without difficulty ?Back: no CVA tenderness;  FROM at waist ?Abdomen: soft, non-tender ?GU: deferred ?Skin: warm and dry ?Psychological: alert and cooperative; normal mood and affect. ? ? ?Labs Reviewed - No data to display ? ?No Known Allergies ? ?Past Medical History:  ?Diagnosis Date  ? Heart murmur   ? ?Family History  ?Problem Relation Age of Onset  ? Healthy Mother   ? Healthy Father   ? ?Social History  ? ?Socioeconomic History  ? Marital status: Single  ?  Spouse name: Not on file  ? Number of children: Not on file  ? Years of education: Not on file  ? Highest education level: Not on file  ?Occupational History  ? Not on file  ?Tobacco Use  ? Smoking status: Former  ? Smokeless tobacco: Never  ?Vaping Use  ? Vaping Use: Never used  ?Substance and Sexual Activity  ? Alcohol use: No  ? Drug use: Yes  ?  Types: Marijuana  ? Sexual activity: Not on file  ?Other Topics Concern  ? Not on file  ?Social History Narrative  ? Not on file  ? ?Social Determinants of Health  ? ?Financial Resource Strain: Not on file  ?Food Insecurity: Not on file  ?Transportation Needs: Not on file  ?Physical Activity: Not on file  ?Stress: Not on file  ?Social Connections: Not on file  ?Intimate Partner Violence: Not on file  ? ? ? ? ? ? ? ?  ?Vanessa Kick, MD ?01/22/22 1124 ? ?

## 2022-01-22 NOTE — Discharge Instructions (Signed)

## 2022-01-23 LAB — CYTOLOGY, (ORAL, ANAL, URETHRAL) ANCILLARY ONLY
Chlamydia: NEGATIVE
Comment: NEGATIVE
Comment: NEGATIVE
Comment: NORMAL
Neisseria Gonorrhea: NEGATIVE
Trichomonas: NEGATIVE

## 2022-02-12 ENCOUNTER — Ambulatory Visit: Payer: Self-pay

## 2022-02-14 ENCOUNTER — Ambulatory Visit: Payer: Self-pay

## 2022-02-15 ENCOUNTER — Ambulatory Visit
Admission: RE | Admit: 2022-02-15 | Discharge: 2022-02-15 | Disposition: A | Discharge status: Home / Self Care | Payer: Medicaid Other | Source: Ambulatory Visit | Attending: Emergency Medicine | Admitting: Emergency Medicine

## 2022-02-15 ENCOUNTER — Ambulatory Visit: Payer: Self-pay

## 2022-02-15 VITALS — BP 116/66 | HR 58 | Temp 98.0°F | Resp 98

## 2022-02-15 DIAGNOSIS — R369 Urethral discharge, unspecified: Secondary | ICD-10-CM

## 2022-02-15 DIAGNOSIS — Z202 Contact with and (suspected) exposure to infections with a predominantly sexual mode of transmission: Secondary | ICD-10-CM

## 2022-02-15 MED ORDER — DOXYCYCLINE HYCLATE 100 MG PO CAPS
100.0000 mg | ORAL_CAPSULE | Freq: Two times a day (BID) | ORAL | 0 refills | Status: AC
Start: 2022-02-15 — End: 2022-02-22

## 2022-02-15 MED ORDER — CEFTRIAXONE SODIUM 500 MG IJ SOLR
500.0000 mg | Freq: Once | INTRAMUSCULAR | Status: AC
  Administered 2022-02-15: 500 mg via INTRAMUSCULAR
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
## 2022-02-15 NOTE — Discharge Instructions (Signed)
Based on the history you provided to me today, you were treated empirically for gonorrhea with an injection of ceftriaxone 500 mg.  This is the only treatment you will need for gonorrhea.  Please abstain from sexual intercourse for 7 days.   Based on the history that you provided to me today, you were treated empirically for chlamydia with a prescription for doxycycline, 1 tablet twice daily for the next 7 days.  Please take all tablets as prescribed, do not skip doses.  Failure to take all doses as prescribed can result in a worsening infection that will be more difficult to treat and resolve.  Please abstain from sexual intercourse for 7 days.   The results of your STD testing today will be made available to you once they are complete, this typically takes 3 to 5 days.  They will initially be posted to your MyChart and, if any of your results are abnormal, you will receive a phone call with those results along with further instructions regarding any further treatment, if needed.    If you have not had complete resolution of your symptoms after completing treatment, please return for repeat evaluation.   Thank you for visiting urgent care today.  I appreciate the opportunity to participate in your care.
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
## 2022-02-16 LAB — CYTOLOGY, (ORAL, ANAL, URETHRAL) ANCILLARY ONLY
Chlamydia: NEGATIVE
Comment: NEGATIVE
Comment: NEGATIVE
Comment: NORMAL
Neisseria Gonorrhea: NEGATIVE
Trichomonas: NEGATIVE

## 2023-05-07 ENCOUNTER — Ambulatory Visit (HOSPITAL_COMMUNITY)
Admission: EM | Admit: 2023-05-07 | Discharge: 2023-05-07 | Disposition: A | Discharge status: Home / Self Care | Payer: Medicaid Other | Attending: Family Medicine | Admitting: Family Medicine

## 2023-05-07 ENCOUNTER — Ambulatory Visit (INDEPENDENT_AMBULATORY_CARE_PROVIDER_SITE_OTHER): Payer: Medicaid Other

## 2023-05-07 ENCOUNTER — Encounter (HOSPITAL_COMMUNITY): Payer: Self-pay | Admitting: *Deleted

## 2023-05-07 DIAGNOSIS — S62346A Nondisplaced fracture of base of fifth metacarpal bone, right hand, initial encounter for closed fracture: Secondary | ICD-10-CM

## 2023-05-07 NOTE — Progress Notes (Signed)
Orthopedic Tech Progress Note Patient Details:  Daniel [name] 1996-06-11 [id number]  Ortho Devices Type of Ortho Device: Ulna gutter splint Ortho Device/Splint Location: RUE Ortho Device/Splint Interventions: Ordered, Application, Adjustment   Post Interventions Patient Tolerated: Well Instructions Provided: Care of device  [place] A Gerilyn Pilgrim 05/07/2023, 8:05 PM

## 2023-05-07 NOTE — Discharge Instructions (Addendum)
If not allergic, you may use over the counter ibuprofen or acetaminophen as needed. ° °

## 2023-05-07 NOTE — ED Notes (Signed)
Called ortho tech 

## 2023-05-07 NOTE — ED Notes (Signed)
Pt declined a sling.

## 2023-05-07 NOTE — ED Triage Notes (Addendum)
Pt states he has a punching thing at work and he missed and hit the wall yesterday. He is having right hand pain, states that there was more swelling yesterday but he wrapped it which helped. He has taken tylenol and IBU.    Pt states he needs a note for work

## 2023-05-08 NOTE — ED Provider Notes (Signed)
[facility]   [id number] 05/07/23 Arrival Time: 1721  ASSESSMENT & PLAN:  1. Closed nondisplaced fracture of base of fifth metacarpal bone of right hand, initial encounter     I have personally viewed and independently interpreted the imaging studies ordered this visit. Fx of base of R 5th metacarpal.  Dr. Janee Morn aware and will have office call pt tomorrow for f/u evaluation.  Orthopaedic tech to apply ulnar gutter splint. Pt declines sling.  Orders Placed This Encounter  Procedures   DG Hand Complete Right   Apply splint short arm   Work note provided.  Recommend:  Follow-up Information     Schedule an appointment as soon as possible for a visit  with Mack Hook, MD.   Specialty: Orthopedic Surgery Contact information: [address] [place] [address] [phone]                OTC ibuprofen/Tylenol as needed.  Reviewed expectations re: course of current medical issues. Questions answered. Outlined signs and symptoms indicating need for more acute intervention. Patient verbalized understanding. After Visit Summary given.  SUBJECTIVE: History from: patient. Daniel [name] is a 27 y.o. male who reports states he has a "punching thing" at work; missed and hit wall; yesterday; continued R hand pain; mild swelling. No extremity sensation changes or weakness. Ibuprofen with help.  Past Surgical History:  Procedure Laterality Date   HERNIA REPAIR       OBJECTIVE:  Vitals:   05/07/23 1842  BP: 129/85  Pulse: (!) 57  Resp: 18  Temp: 98.4 F (36.9 C)  TempSrc: Oral  SpO2: 96%    General appearance: alert; no distress HEENT: [place]; AT Neck: supple with FROM Resp: unlabored respirations Extremities: RUE: warm with well perfused appearance; fairly well localized moderate tenderness over right hand (mostly over 5th metacarpal); without gross deformities; swelling: minimal; bruising: none; all fingers with FROM CV: brisk extremity  capillary refill of RUE; 2+ radial pulse of RUE. Skin: warm and dry; no visible rashes Neurologic: gait normal; normal sensation and strength of RUE Psychological: alert and cooperative; normal mood and affect  Imaging: DG Hand Complete Right  Result Date: 05/07/2023 CLINICAL DATA:  Pain, injury. EXAM: RIGHT HAND - COMPLETE 3+ VIEW COMPARISON:  Right hand x-ray 08/23/2015 FINDINGS: Small ossific density seen along the ventral base of the fifth metacarpal which may represent small nondisplaced fracture. There is no dislocation. Soft tissues are within normal limits. IMPRESSION: Small ossific density along the ventral base of the fifth metacarpal which may represent small nondisplaced fracture. Electronically Signed   By: Darliss Cheney M.D.   On: 05/07/2023 19:31      No Known Allergies  Past Medical History:  Diagnosis Date   Heart murmur    Social History   Socioeconomic History   Marital status: Single    Spouse name: Not on file   Number of children: Not on file   Years of education: Not on file   Highest education level: Not on file  Occupational History   Not on file  Tobacco Use   Smoking status: Never   Smokeless tobacco: Never  Vaping Use   Vaping status: Never Used  Substance and Sexual Activity   Alcohol use: No   Drug use: Not Currently    Types: Marijuana   Sexual activity: Yes    Birth control/protection: None  Other Topics Concern   Not on file  Social History Narrative   Not on file   Social  Determinants of Health   Financial Resource Strain: Not on file  Food Insecurity: Not on file  Transportation Needs: Not on file  Physical Activity: Not on file  Stress: Not on file  Social Connections: Not on file   Family History  Problem Relation Age of Onset   Healthy Mother    Healthy Father    Past Surgical History:  Procedure Laterality Date   HERNIA REPAIR         Mardella Layman, MD 05/08/23 [phone]

## 2023-10-15 ENCOUNTER — Ambulatory Visit
Admission: EM | Admit: 2023-10-15 | Discharge: 2023-10-15 | Disposition: A | Discharge status: Home / Self Care | Payer: Medicaid Other | Attending: Internal Medicine | Admitting: Internal Medicine

## 2023-10-15 ENCOUNTER — Encounter: Payer: Self-pay | Admitting: Emergency Medicine

## 2023-10-15 DIAGNOSIS — J111 Influenza due to unidentified influenza virus with other respiratory manifestations: Secondary | ICD-10-CM | POA: Diagnosis not present

## 2023-10-15 DIAGNOSIS — R112 Nausea with vomiting, unspecified: Secondary | ICD-10-CM | POA: Diagnosis not present

## 2023-10-15 DIAGNOSIS — R509 Fever, unspecified: Secondary | ICD-10-CM

## 2023-10-15 LAB — POCT INFLUENZA A/B
Influenza A, POC: NEGATIVE
Influenza B, POC: NEGATIVE

## 2023-10-15 MED ORDER — ONDANSETRON 4 MG PO TBDP: 4.0000 mg | ORAL_TABLET | Freq: Three times a day (TID) | ORAL | 0 refills | Status: AC | PRN

## 2023-10-15 MED ORDER — OSELTAMIVIR PHOSPHATE 75 MG PO CAPS: 75.0000 mg | ORAL_CAPSULE | Freq: Two times a day (BID) | ORAL | 0 refills | Status: AC

## 2023-10-15 MED ORDER — PROMETHAZINE-DM 6.25-15 MG/5ML PO SYRP
5.0000 mL | ORAL_SOLUTION | Freq: Every evening | ORAL | 0 refills | Status: AC | PRN
Start: 2023-10-15 — End: ?

## 2023-10-15 NOTE — Discharge Instructions (Addendum)
Your flu test was negative but I am not convinced that you do not have the flu. I'd like to treat this as flu. Take Tamiflu every 12 hours for the next 5 days to improve symptoms and stop the virus from replicating in your body. Ibuprofen/tylenol as needed for fevers and body aches. Mucinex as needed for nasal congestion. Zofran as needed for nausea/vomiting. Promethazine DM at bedtime as needed for cough- this will make you sleepy so please only take at bedtime.   If you develop any new or worsening symptoms or if your symptoms do not start to improve, please return here or follow-up with your primary care provider. If your symptoms are severe, please go to the emergency room.

## 2023-10-15 NOTE — ED Provider Notes (Signed)
[facility]    CSN: [id number] Arrival date & time: 10/15/23  1822      History   Chief Complaint Chief Complaint  Patient presents with   Cough   Sore Throat   Emesis    HPI Daniel [name] is a 28 y.o. male.   Daniel [name] is a 28 y.o. male presenting for chief complaint of Cough, Sore Throat, and Emesis that started last night. He had 1 episode of non-bloody/non-bilious emesis yesterday. Cough is non-productive and mild. Denies diarrhea, CP, SOB, rash, and dizziness. He is unsure of max temp at home. Never smoker, denies history of asthma/chronic respiratory problems. Taking dayquil with some relief.    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 Authorizing Provider  ondansetron (ZOFRAN-ODT) 4 MG disintegrating tablet Take 1 tablet (4 mg total) by mouth every 8 (eight) hours as needed for nausea or vomiting. 10/15/23  Yes Carlisle Beers, FNP  oseltamivir (TAMIFLU) 75 MG capsule Take 1 capsule (75 mg total) by mouth every 12 (twelve) hours. 10/15/23  Yes Carlisle Beers, FNP  promethazine-dextromethorphan (PROMETHAZINE-DM) 6.25-15 MG/5ML syrup Take 5 mLs by mouth at bedtime as needed for cough. 10/15/23  Yes Carlisle Beers, FNP  ibuprofen (ADVIL) 400 MG tablet Take 1 tablet (400 mg total) by mouth every 8 (eight) hours as needed for up to 30 doses. 12/12/21   Theadora Rama Scales, PA-C  lidocaine (XYLOCAINE) 2 % solution Use as directed 15 mLs in the mouth or throat every 3 (three) hours as needed for mouth pain (Sore throat). 12/12/21   Theadora Rama Scales, PA-C    Family History Family History   Problem Relation Age of Onset   Healthy Mother    Healthy Father     Social History Social History   Tobacco Use   Smoking status: Never   Smokeless tobacco: Never  Vaping Use   Vaping status: Never Used  Substance Use Topics   Alcohol use: No   Drug use: Not Currently    Types: Marijuana     Allergies   Patient has no known allergies.   Review of Systems Review of Systems  Respiratory:  Positive for cough.   Gastrointestinal:  Positive for vomiting.  Per HPI   Physical Exam Triage Vital Signs ED Triage Vitals  Encounter Vitals Group     BP 10/15/23 1908 121/70     Systolic BP Percentile --      Diastolic BP Percentile --      Pulse Rate 10/15/23 1908 80     Resp 10/15/23 1908 17     Temp 10/15/23 1908 (!) 100.4 F (38 C)     Temp Source 10/15/23 1908 Oral     SpO2 10/15/23 1908 96 %     Weight --      Height --      Head Circumference --      Peak Flow --      Pain Score 10/15/23 1907 8     Pain Loc --      Pain  Education --      Exclude from [employer] Chart --    No data found.  Updated Vital Signs BP 121/70 (BP Location: Right Arm)   Pulse 80   Temp (!) 100.4 F (38 C) (Oral)   Resp 17   SpO2 96%   Visual Acuity Right Eye Distance:   Left Eye Distance:   Bilateral Distance:    Right Eye Near:   Left Eye Near:    Bilateral Near:     Physical Exam Vitals and nursing note reviewed.  Constitutional:      Appearance: He is ill-appearing. He is not toxic-appearing.  HENT:     Head: Normocephalic and atraumatic.     Right Ear: Hearing, tympanic membrane, ear canal and external ear normal.     Left Ear: Hearing, tympanic membrane, ear canal and external ear normal.     Nose: Congestion present.     Mouth/Throat:     Lips: Pink.     Mouth: Mucous membranes are moist. No injury or oral lesions.     Dentition: Normal dentition.     Tongue: No lesions.     Pharynx: Oropharynx is clear. Uvula midline. No pharyngeal swelling, oropharyngeal  exudate, posterior oropharyngeal erythema, uvula swelling or postnasal drip.     Tonsils: No tonsillar exudate.  Eyes:     General: Lids are normal. Vision grossly intact. Gaze aligned appropriately.     Extraocular Movements: Extraocular movements intact.     Conjunctiva/sclera: Conjunctivae normal.  Neck:     Trachea: Trachea and phonation normal.  Cardiovascular:     Rate and Rhythm: Normal rate and regular rhythm.     Heart sounds: Normal heart sounds, S1 normal and S2 normal.  Pulmonary:     Effort: Pulmonary effort is normal. No respiratory distress.     Breath sounds: Normal breath sounds and air entry. No wheezing, rhonchi or rales.  Chest:     Chest wall: No tenderness.  Musculoskeletal:     Cervical back: Neck supple.  Lymphadenopathy:     Cervical: No cervical adenopathy.  Skin:    General: Skin is warm and dry.     Capillary Refill: Capillary refill takes less than 2 seconds.     Findings: No rash.  Neurological:     General: No focal deficit present.     Mental Status: He is alert and oriented to person, place, and time. Mental status is at baseline.     Cranial Nerves: No dysarthria or facial asymmetry.  Psychiatric:        Mood and Affect: Mood normal.        Speech: Speech normal.        Behavior: Behavior normal.        Thought Content: Thought content normal.        Judgment: Judgment normal.      UC Treatments / Results  Labs (all labs ordered are listed, but only abnormal results are displayed) Labs Reviewed  POCT INFLUENZA A/B - Normal    EKG   Radiology No results found.  Procedures Procedures (including critical care time)  Medications Ordered in UC Medications - No data to display  Initial Impression / Assessment and Plan / UC Course  I have reviewed the triage vital signs and the nursing notes.  Pertinent labs & imaging results that were available during my care of the patient were reviewed by me and considered in my medical  decision making (see chart for details).  1. Influenza-like illness, nausea and vomiting, fever High clinical suspicion for influenza given high fever and quick onset of symptoms.  Will go ahead and treat with Tamiflu given clinical suspicion even though POC flu testing negative.  Otherwise recommend supportive care for symptomatic relief as outlined in AVS.  Patient nontoxic appearing with hemodynamically stable vital signs, therefore deferred imaging of the chest.  Modes of transmission, quarantine recommendations, and hand hygiene discussed.  Counseled patient on potential for adverse effects with medications prescribed/recommended today, strict ER and return-to-clinic precautions discussed, patient verbalized understanding.    Final Clinical Impressions(s) / UC Diagnoses   Final diagnoses:  Influenza-like illness  Nausea and vomiting, unspecified vomiting type  Fever, unspecified     Discharge Instructions      Your flu test was negative but I am not convinced that you do not have the flu. I'd like to treat this as flu. Take Tamiflu every 12 hours for the next 5 days to improve symptoms and stop the virus from replicating in your body. Ibuprofen/tylenol as needed for fevers and body aches. Mucinex as needed for nasal congestion. Zofran as needed for nausea/vomiting. Promethazine DM at bedtime as needed for cough- this will make you sleepy so please only take at bedtime.   If you develop any new or worsening symptoms or if your symptoms do not start to improve, please return here or follow-up with your primary care provider. If your symptoms are severe, please go to the emergency room.     ED Prescriptions     Medication Sig Dispense Auth. Provider   oseltamivir (TAMIFLU) 75 MG capsule Take 1 capsule (75 mg total) by mouth every 12 (twelve) hours. 10 capsule Carlisle Beers, FNP   promethazine-dextromethorphan (PROMETHAZINE-DM) 6.25-15 MG/5ML syrup Take 5 mLs by  mouth at bedtime as needed for cough. 118 mL Reita May M, FNP   ondansetron (ZOFRAN-ODT) 4 MG disintegrating tablet Take 1 tablet (4 mg total) by mouth every 8 (eight) hours as needed for nausea or vomiting. 20 tablet Carlisle Beers, FNP      PDMP not reviewed this encounter.   Carlisle Beers, [place] 10/15/23 1934

## 2023-10-15 NOTE — ED Triage Notes (Signed)
Pt c/o vomiting, cough, sore throat for 1 day.    Took dayquil around noon today.
